# Patient Record
Sex: Male | Born: 1935 | Race: White | Hispanic: No | Smoking: Former smoker
Health system: Southern US, Community
[De-identification: ages and names within clinical notes are randomized; demographics above are authoritative.]

## PROBLEM LIST (undated history)

## (undated) DIAGNOSIS — I739 Peripheral vascular disease, unspecified: Secondary | ICD-10-CM

## (undated) DIAGNOSIS — I10 Essential (primary) hypertension: Secondary | ICD-10-CM

## (undated) DIAGNOSIS — K648 Other hemorrhoids: Secondary | ICD-10-CM

## (undated) DIAGNOSIS — I440 Atrioventricular block, first degree: Secondary | ICD-10-CM

## (undated) DIAGNOSIS — I480 Paroxysmal atrial fibrillation: Secondary | ICD-10-CM

## (undated) DIAGNOSIS — I779 Disorder of arteries and arterioles, unspecified: Secondary | ICD-10-CM

## (undated) DIAGNOSIS — K635 Polyp of colon: Secondary | ICD-10-CM

## (undated) DIAGNOSIS — R0789 Other chest pain: Secondary | ICD-10-CM

## (undated) HISTORY — DX: Other hemorrhoids: K64.8

## (undated) HISTORY — DX: Peripheral vascular disease, unspecified: I73.9

## (undated) HISTORY — PX: OTHER SURGICAL HISTORY: SHX169

## (undated) HISTORY — DX: Disorder of arteries and arterioles, unspecified: I77.9

## (undated) HISTORY — PX: LUMBAR LAMINECTOMY: SHX95

## (undated) HISTORY — DX: Other chest pain: R07.89

## (undated) HISTORY — DX: Polyp of colon: K63.5

## (undated) HISTORY — DX: Essential (primary) hypertension: I10

## (undated) HISTORY — DX: Paroxysmal atrial fibrillation: I48.0

## (undated) HISTORY — DX: Atrioventricular block, first degree: I44.0

## (undated) HISTORY — PX: CHOLECYSTECTOMY: SHX55

---

## 2006-10-30 ENCOUNTER — Ambulatory Visit: Payer: Self-pay | Admitting: Cardiology

## 2007-02-10 ENCOUNTER — Ambulatory Visit: Payer: Self-pay | Admitting: Cardiology

## 2007-02-11 ENCOUNTER — Encounter: Payer: Self-pay | Admitting: Cardiology

## 2007-03-07 ENCOUNTER — Ambulatory Visit: Payer: Self-pay | Admitting: Cardiology

## 2007-09-23 ENCOUNTER — Encounter: Payer: Self-pay | Admitting: Cardiology

## 2007-12-27 ENCOUNTER — Encounter: Payer: Self-pay | Admitting: Cardiology

## 2008-01-15 ENCOUNTER — Encounter: Payer: Self-pay | Admitting: Cardiology

## 2008-09-11 ENCOUNTER — Ambulatory Visit: Payer: Self-pay | Admitting: Cardiology

## 2008-09-15 ENCOUNTER — Encounter: Payer: Self-pay | Admitting: Cardiology

## 2008-10-28 DIAGNOSIS — I480 Paroxysmal atrial fibrillation: Secondary | ICD-10-CM | POA: Insufficient documentation

## 2009-05-11 DIAGNOSIS — C4492 Squamous cell carcinoma of skin, unspecified: Secondary | ICD-10-CM

## 2009-05-11 HISTORY — DX: Squamous cell carcinoma of skin, unspecified: C44.92

## 2009-05-13 ENCOUNTER — Encounter: Payer: Self-pay | Admitting: Cardiology

## 2009-10-19 ENCOUNTER — Ambulatory Visit: Payer: Self-pay | Admitting: Cardiology

## 2009-10-19 DIAGNOSIS — I10 Essential (primary) hypertension: Secondary | ICD-10-CM

## 2010-03-08 NOTE — Assessment & Plan Note (Signed)
Summary: one year follow up/rm   Visit Type:  Follow-up Primary Cullan Launer:  Sherril Croon   History of Present Illness: the patient is an 77 year old male with a longstanding history of paroxysmal atrial fibrillation. The patient denies any chest pain, shortness of breath, orthopnea, or PND. He has a CHADS score of 1-2.he's taking Coumadin and is tolerating without any side effects.  Does have occasional bruises however no significant bleeding.  He reports occasional palpitations.  He keeps a record of his blood pressure at home and this has been within normal limits.  Preventive Screening-Counseling & Management  Alcohol-Tobacco     Smoking Status: quit     Year Quit: 1946  Current Medications (verified): 1)  Inderal La 60 Mg Xr24h-Cap (Propranolol Hcl) .... Take 1 Tablet By Mouth Once A Day 2)  Doxepin Hcl 25 Mg Caps (Doxepin Hcl) .... Take 1 Tablet By Mouth Once A Day 3)  Coumadin 5 Mg Tabs (Warfarin Sodium) .... Use As Directed 4)  Lanoxin 0.125 Mg Tabs (Digoxin) .... Take 1 Tablet By Mouth Once A Day 5)  Flomax 0.4 Mg Caps (Tamsulosin Hcl) .... Take 1 Tablet By Mouth Once A Day 6)  Avodart 0.5 Mg Caps (Dutasteride) .... Take 1 Tablet By Mouth Once A Day 7)  Flurazepam Hcl 15 Mg Caps (Flurazepam Hcl) .... Take 1 Tablet By Mouth Once A Day  Allergies (verified): 1)  ! Pcn  Comments:  Nurse/Medical Assistant: The patient's medication list and allergies were reviewed with the patient and were updated in the Medication and Allergy Lists.  Past History:  Past Medical History: Last updated: 10/28/2008 ATRIAL FIBRILLATION (ICD-427.31)    Past Surgical History: Last updated: 10/28/2008 Cataract Extraction Bladder  Family History: Last updated: 10/28/2008 Family History of Cancer:  Alzheimer's  Social History: Last updated: 10/28/2008 Full Time Tobacco Use - No.  Alcohol Use - yes  Risk Factors: Smoking Status: quit (10/19/2009)  Social History: Smoking Status:   quit  Review of Systems       The patient complains of fatigue.  The patient denies malaise, fever, weight gain/loss, vision loss, decreased hearing, hoarseness, chest pain, palpitations, shortness of breath, prolonged cough, wheezing, sleep apnea, coughing up blood, abdominal pain, blood in stool, nausea, vomiting, diarrhea, heartburn, incontinence, blood in urine, muscle weakness, joint pain, leg swelling, rash, skin lesions, headache, fainting, dizziness, depression, anxiety, enlarged lymph nodes, easy bruising or bleeding, and environmental allergies.    Vital Signs:  Patient profile:   77 year old male Height:      72 inches Weight:      182 pounds BMI:     24.77 Pulse rate:   51 / minute BP sitting:   150 / 82  (left arm) Cuff size:   regular  Vitals Entered By: Carlye Grippe (October 19, 2009 8:46 AM)  Physical Exam  Additional Exam:  General: Well-developed, well-nourished in no distress head: Normocephalic and atraumatic eyes PERRLA/EOMI intact, conjunctiva and lids normal nose: No deformity or lesions mouth normal dentition, normal posterior pharynx neck: Supple, no JVD.  No masses, thyromegaly or abnormal cervical nodes lungs: Normal breath sounds bilaterally without wheezing.  Normal percussion heart: regular rate and rhythm with normal S1 and S2, no S3 or S4.  PMI is normal.  No pathological murmurs abdomen: Normal bowel sounds, abdomen is soft and nontender without masses, organomegaly or hernias noted.  No hepatosplenomegaly musculoskeletal: Back normal, normal gait muscle strength and tone normal pulsus: Pulse is normal in all 4 extremities Extremities:  No peripheral pitting edema neurologic: Alert and oriented x 3 skin: Intact without lesions or rashes cervical nodes: No significant adenopathy psychologic: Normal affect    EKG  Procedure date:  10/19/2009  Findings:       sinus bradycardia nonspecific ST-T wave changes  Impression &  Recommendations:  Problem # 1:  ATRIAL FIBRILLATION (ICD-427.31) no recurrence the patient is in normal sinus rhythm. His updated medication list for this problem includes:    Inderal La 60 Mg Xr24h-cap (Propranolol hcl) .Marland Kitchen... Take 1 tablet by mouth once a day    Coumadin 5 Mg Tabs (Warfarin sodium) ..... Use as directed    Lanoxin 0.125 Mg Tabs (Digoxin) .Marland Kitchen... Take 1 tablet by mouth once a day  Orders: EKG w/ Interpretation (93000)  Problem # 2:  ESSENTIAL HYPERTENSION, BENIGN (ICD-401.1) blood pressure controlled with current medical regimen. His updated medication list for this problem includes:    Inderal La 60 Mg Xr24h-cap (Propranolol hcl) .Marland Kitchen... Take 1 tablet by mouth once a day  Problem # 3:  COUMADIN THERAPY (ICD-V58.61) Assessment: Comment Only  Patient Instructions: 1)  Your physician recommends that you continue on your current medications as directed. Please refer to the Current Medication list given to you today. 2)  Follow up in  1 year.

## 2010-06-06 ENCOUNTER — Encounter: Payer: Self-pay | Admitting: Cardiology

## 2010-06-06 ENCOUNTER — Ambulatory Visit (INDEPENDENT_AMBULATORY_CARE_PROVIDER_SITE_OTHER): Payer: Medicare Other | Admitting: Cardiology

## 2010-06-06 ENCOUNTER — Encounter: Payer: Self-pay | Admitting: *Deleted

## 2010-06-06 VITALS — BP 168/85 | HR 58 | Ht 72.0 in | Wt 188.0 lb

## 2010-06-06 DIAGNOSIS — I4891 Unspecified atrial fibrillation: Secondary | ICD-10-CM

## 2010-06-06 DIAGNOSIS — Z01810 Encounter for preprocedural cardiovascular examination: Secondary | ICD-10-CM

## 2010-06-06 DIAGNOSIS — I6529 Occlusion and stenosis of unspecified carotid artery: Secondary | ICD-10-CM

## 2010-06-06 NOTE — Assessment & Plan Note (Signed)
The patient is at acceptable risk for the planned surgery according to ACC/AHA guidelines. No further cardiovascular testing is suggested. He can come off his Coumadin 5 days prior to surgery and restart it when suggested by his surgeon.

## 2010-06-06 NOTE — Progress Notes (Signed)
HPI Patient is a pleasant gentleman who is being considered for cholecystectomy. He has a history of atrial fibrillation. He has maintained sinus rhythm and maintain his Coumadin. He has no significant thromboembolic risk other than probable hypertension and his age. He has not felt any tachycardia palpitations except for one episode where his heart rate was said to be elevated on a treadmill though he didn't feel this. In the past he has known when he has had fibrillation. He has had no presyncope or syncope. He denies any chest pressure, neck or arm discomfort. He has had no shortness of breath, PND or orthopnea. He has had no weight gain or edema. His last stress perfusion study was 2009 negative for any evidence of ischemia.   Allergies  Allergen Reactions  . Penicillins     REACTION: rash    Current Outpatient Prescriptions  Medication Sig Dispense Refill  . doxepin (SINEQUAN) 25 MG capsule Take 50 mg by mouth daily.       Marland Kitchen dutasteride (AVODART) 0.5 MG capsule Take 0.5 mg by mouth daily.        . flurazepam (DALMANE) 15 MG capsule Take 15 mg by mouth at bedtime as needed.        . propranolol (INDERAL LA) 60 MG 24 hr capsule Take 60 mg by mouth daily.        . sildenafil (VIAGRA) 50 MG tablet Take 50 mg by mouth daily as needed.        . Tamsulosin HCl (FLOMAX) 0.4 MG CAPS Take 0.4 mg by mouth daily.        Marland Kitchen warfarin (COUMADIN) 5 MG tablet Take 5 mg by mouth as directed.        Marland Kitchen DISCONTD: digoxin (LANOXIN) 0.125 MG tablet Take 125 mcg by mouth daily.          Past Medical History  Diagnosis Date  . Atrial fibrillation   . Colon polyps   . Internal hemorrhoids     Past Surgical History  Procedure Date  . Abcess of right thigh     ROS:  As stated in the HPI and negative for all other systems.  PHYSICAL EXAM BP 168/85  Pulse 58  Ht 6' (1.829 m)  Wt 188 lb (85.276 kg)  BMI 25.50 kg/m2 GENERAL:  Well appearing.  He looks much younger than his stated age. HEENT:  Pupils  equal round and reactive, fundi not visualized, oral mucosa unremarkable NECK:  No jugular venous distention, waveform within normal limits, carotid upstroke brisk and symmetric, no bruits, no thyromegaly LYMPHATICS:  No cervical, inguinal adenopathy LUNGS:  Clear to auscultation bilaterally BACK:  No CVA tenderness CHEST:  Unremarkable HEART:  PMI not displaced or sustained,S1 and S2 within normal limits, no S3, no S4, no clicks, no rubs, no murmurs ABD:  Flat, positive bowel sounds normal in frequency in pitch, no bruits, no rebound, no guarding, no midline pulsatile mass, no hepatomegaly, no splenomegaly EXT:  2 plus pulses throughout, no edema, no cyanosis no clubbing SKIN:  No rashes no nodules NEURO:  Cranial nerves II through XII grossly intact, motor grossly intact throughout PSYCH:  Cognitively intact, oriented to person place and time   EKG:  Sinus bradycardia, rate 54, axis within normal limits, intervals within normal limits, no acute ST-T wave changes.  ASSESSMENT AND PLAN

## 2010-06-06 NOTE — Assessment & Plan Note (Signed)
The blood pressure continues to be high. I have instructed the patient to record a blood pressure diary and recording this. This will be presented for my review and pending these results I will make further suggestions about changes in therapy for optimal blood pressure control.  

## 2010-06-06 NOTE — Assessment & Plan Note (Signed)
As above.

## 2010-06-06 NOTE — Patient Instructions (Signed)
Your physician (Dr. Earnestine Leys ) you to follow up in 1 year. You will receive a reminder letter in the mail one-two months in advance. If you don't receive a letter, please call our office to schedule the follow-up appointment.  Your physician has requested that you have a carotid duplex. This test is an ultrasound of the carotid arteries in your neck. It looks at blood flow through these arteries that supply the brain with blood. Allow one hour for this exam. There are no restrictions or special instructions. Your physician recommends that you continue on your current medications as directed. Please refer to the Current Medication list given to you today.

## 2010-06-06 NOTE — Assessment & Plan Note (Signed)
He had mild obstruction in 2007 I will repeat carotid Dopplers.

## 2010-06-06 NOTE — Progress Notes (Deleted)
HPI  Allergies  Allergen Reactions  . Penicillins     REACTION: rash    Current Outpatient Prescriptions  Medication Sig Dispense Refill  . doxepin (SINEQUAN) 25 MG capsule Take 50 mg by mouth daily.       Marland Kitchen dutasteride (AVODART) 0.5 MG capsule Take 0.5 mg by mouth daily.        . flurazepam (DALMANE) 15 MG capsule Take 15 mg by mouth at bedtime as needed.        . propranolol (INDERAL LA) 60 MG 24 hr capsule Take 60 mg by mouth daily.        . sildenafil (VIAGRA) 50 MG tablet Take 50 mg by mouth daily as needed.        . Tamsulosin HCl (FLOMAX) 0.4 MG CAPS Take 0.4 mg by mouth daily.        Marland Kitchen warfarin (COUMADIN) 5 MG tablet Take 5 mg by mouth as directed.        Marland Kitchen DISCONTD: digoxin (LANOXIN) 0.125 MG tablet Take 125 mcg by mouth daily.          Past Medical History  Diagnosis Date  . Atrial fibrillation   . Colon polyps   . Internal hemorrhoids     Past Surgical History  Procedure Date  . Abcess of right thigh     ROS: PHYSICAL EXAM BP 168/85  Pulse 58  Ht 6' (1.829 m)  Wt 188 lb (85.276 kg)  BMI 25.50 kg/m2 GENERAL:  Well appearing HEENT:  Pupils equal round and reactive, fundi not visualized, oral mucosa unremarkable NECK:  No jugular venous distention, waveform within normal limits, carotid upstroke brisk and symmetric, no bruits, no thyromegaly LYMPHATICS:  No cervical, inguinal adenopathy LUNGS:  Clear to auscultation bilaterally BACK:  No CVA tenderness CHEST:  Unremarkable HEART:  PMI not displaced or sustained,S1 and S2 within normal limits, no S3, no S4, no clicks, no rubs, no murmurs ABD:  Flat, positive bowel sounds normal in frequency in pitch, no bruits, no rebound, no guarding, no midline pulsatile mass, no hepatomegaly, no splenomegaly EXT:  2 plus pulses throughout, no edema, no cyanosis no clubbing SKIN:  No rashes no nodules NEURO:  Cranial nerves II through XII grossly intact, motor grossly intact throughout PSYCH:  Cognitively intact, oriented  to person place and time  EKG:  ASSESSMENT AND PLAN

## 2010-06-21 NOTE — Assessment & Plan Note (Signed)
[facility]                          [facility] NOTE   NAME:Antonio Moreno, Antonio Moreno                       MRN:          [id number]  DATE:03/07/2007                            DOB:          Mar 25, 1920    HISTORY OF PRESENT ILLNESS:  The patient is a very pleasant 77 year old  male still working with a longstanding paroxysmal atrial fibrillation  who was last seen by Joellyn Rued on October 30, 2006. The patient was  referred by Dr. Cleotis Nipper with the specific question of if the patient  should still continue on Coumadin.  After some discussion, it was felt  that the patient's best option would be to stay on Coumadin at his [place]  score was essentially 2.  The patient was also recently admitted to  [facility] after an episode of abdominal pain and Dr. Sherril Croon  requested a consult from Dr. Andee Poles.  The patient was ruled out for  myocardial infarction and had a Cardiolite stress study done, which was  within normal limits.  The patient felt, in retrospect, that this was  more indigestion like sensation that he had and told me here in the  office today that he did not really have any chest pain on that  admission.  He is doing quite well.  He remains very active, walks  daily. Reports no substernal chest pain or shortness of breath.  He  still continues to travel to [place]. He also goes to see  physicians at the [facility] in [place].   MEDICATIONS:  1. Inderal 60 mg p.o. daily.  2. Lanoxin 25 mg a half tablet daily.  3. Doxepin.  4. Coumadin as directed.  5. Flomax.  6. Amitiza.   PHYSICAL EXAMINATION:  VITAL SIGNS:  Blood pressure 132/69, heart rate  is 58 beats a minute.  NECK:  Normal carotid.  No carotid bruits.  LUNGS:  Clear breath sounds bilaterally.  HEART:  Regular rate and rhythm.  Normal S1, S2. No murmurs, rubs or  gallops.  ABDOMEN:  Soft, nontender; no rebound or guarding.  Good bowel sounds.  EXTREMITIES: no  cyanosis, clubbing or edema.   PROBLEM LIST:  1. History of paroxysmal atrial fibrillation.  2. Coumadin anticoagulation, [place] score 2.  3. Status post recent admission for atypical abdominal pain.      a.     Negative Cardiolite imaging study.   PLAN:  1. The patient is doing remarkably well for his age.  He appears to be      at low risk for coronary disease given his Cardiolite results.  2. Made no changes in the patient medical regimen.  3. We had a discussion about Coumadin again today. Despite the      patients advanced age, it      still seems very safe option and he certainly is at increased risk      for stroke given the [place] score of 2.     Learta Codding, MD,FACC  Electronically Signed    GED/MedQ  DD: 03/07/2007  DT: 03/07/2007  Job #: [id number]

## 2010-06-21 NOTE — Assessment & Plan Note (Signed)
[facility]                          [facility]ICE NOTE   NAME:Mckeen, DIVANTE KOTCH                       MRN:          [id number]  DATE:10/30/2006                            DOB:          [birth year]    SUMMARY OF HISTORY:  Mr. Hoffmann is an 77 year old white male who is  referred from Dr. Cleotis Nipper in regards to possible cessation of  anticoagulation therapy with Coumadin.   Mr. Styer states that he has a long history of atrial fibrillation  dating back to age 89. He states that when he has atrial fibrillation,  he will have a rapid ventricular rate and this is associated with a  feeling of faintness that he cannot specifically elaborate. He has not  had any syncopal episodes. He thinks that his last episode of atrial  fibrillation was over a year ago and this lasted approximately for 10  minutes. He states that he was started on Coumadin in 2003 when he was  hospitalized at [facility] for atrial fibrillation, he states  that at that time it lasted slightly over 2 hours and it was recommended  that he stay on anticoagulation therapy. He said that he was also  followed up at the [facility] in March 2007 and they also, again,  recommended continued Coumadin therapy, although with his extensive  travels to potentially [place], it would be difficult to maintain  his INRs.   ALLERGIES:  COUMADIN.   MEDICATIONS:  1. Inderal 60 mg daily.  2. Lanoxin 25 mg 1/2 tablet daily.  3. Amitiza 24 mg daily.  4. Doxepin 50 mg daily.  5. Coumadin as directed per primary care physician.  6. Omega 3 1200 mg daily.  7. Flomax p.r.n.  8. Dalmane p.r.n.  9. Glucosamine p.r.n.   PAST MEDICAL HISTORY:  In addition to paroxysmal atrial fibrillation is  notable tonsillectomy, bladder biopsy, diverticulosis, remote colon  polyps, and internal hemorrhoids. He states that he has just had a  colonoscopy within the last couple of weeks and was given a  clean bill  of health. He has a history of a transanal resection of a tubular  adenoma of the rectum and ligation of internal hemorrhoids in August  2005.   SOCIAL HISTORY:  Remain unchanged. [place] and [place].   FAMILY HISTORY:  Remain unchanged.   EKG in the office shows sinus bradycardia with a ventricular rate of 50,  normal axis, non-specific ST-T wave changes, possibly dig effect.   PHYSICAL EXAMINATION:  GENERAL:  Well developed, well nourished,  pleasant white male in no apparent distress. He appears younger than his  stated age.  VITAL SIGNS:  Blood pressure 151/84, recheck 136/70, heart rate 53 and  regular, weight 192.8.  HEENT:  Unremarkable.  NECK:  Supple without thyromegaly, adenopathy, JVD, or carotid bruits.  CHEST:  Symmetrical excursion. I did not appreciate any rales, rhonchi,  or wheezing.  HEART:  PMI is not displaced. Regular rate and rhythm. Normal S1 and S2.  I did  not appreciate any murmurs, rubs, clicks, or gallops. I did not  appreciate any abdominal or femoral bruits. All peripheral pulses were  symmetrical and intact.  ABDOMEN:  Flat, bowel sounds present without organomegaly, masses, or  tenderness.  EXTREMITIES:  Negative cyanosis, clubbing, or edema.  MUSCULOSKELETAL:  Unremarkable.  NEUROLOGIC:  Unremarkable.   IMPRESSION:  1. History of paroxysmal atrial fibrillation with probably the last      event at least one year ago.  2. Anticoagulation managed by his primary care physician.  3. History as noted above.   DISPOSITION:  I reviewed with Mr. Cacioppo the indication for Coumadin  based on his [place] scoring. I also provided him with information of the  guidance of management with patients of atrial fibrillation representing  [place] scoring. Using this information, the patient has a [place] score of 1.  However, the patient states that he has a history of  borderline  diabetes, however his last hemoglobin a1C within the last year was 5.5.  If we include his borderline diabetes, he has a [place] score of 2. I  have informed Mr. Loeber that he could potentially discontinue Coumadin  and begin aspirin for protective risk. However, at this time, Mr. Hoglund  is reluctant to discontinue Coumadin as he has not had any problems with  it in the past. He states that he is willing to look over the  information provided, give it some thought, as well as discuss this with  Dr. Cleotis Nipper and Dr. Sherril Croon. He will inform us of his decision as to  whether he wishes to continue Coumadin or discontinue Coumadin and begin  aspirin.      Joellyn Rued, PA-C  Electronically Signed      Learta Codding, MD,FACC  Electronically Signed   EW/MedQ  DD: 10/30/2006  DT: 10/31/2006  Job #: [id number]   cc:   Henry A. Cleotis Nipper, M.D.  Dhruv Sherril Croon

## 2010-06-21 NOTE — Assessment & Plan Note (Signed)
[facility]                          [facility]ICE NOTE   NAME:Antonio Moreno, Antonio Moreno                       MRN:          [id number]  DATE:09/11/2008                            DOB:          Feb 21, 1920    HISTORY OF PRESENT ILLNESS:  The patient is a very pleasant 77 year old  male, still working with a longstanding history of paroxysmal atrial  fibrillation.  The patient denies any chest pain, shortness of breath,  orthopnea, or PND.  He has a CHADS score of 1-2.  He does have at times  elevated blood sugars, but does not have hypertension.  The patient is,  however, on Coumadin.  The patient travels quite a bit including trips  to [place] and [place], and he also has physicians at the [facility] in [place].  The patient is otherwise asymptomatic.   MEDICATIONS:  1. Inderal 60 mg p.o. daily.  2. Doxepin 50 mg p.o. daily.  3. Coumadin as directed.  4. Lanoxin 0.25 mg 1/2 tab p.o. daily.  5. Flomax 0.4 mg daily.  6. Avodart 0.5 mg p.o. daily.  7. Amitiza 24 mg p.o. b.i.d.   PHYSICAL EXAMINATION:  VITAL SIGNS:  Blood pressure is 128/86, heart  rate is 52, and weight is 181 pounds.  GENERAL:  A well-nourished white male in no apparent distress.  HEENT:  Pupils are anisocoric.  Conjunctivae are clear.  NECK:  Supple.  Normal carotid upstroke.  No carotid bruits.  LUNGS:  Clear breath sounds bilaterally.  HEART:  Regular rate and rhythm.  Normal S1 and S2.  No murmur, rubs, or  gallops.  ABDOMEN:  Soft and nontender.  No rebound or guarding.  Good bowel  sounds.  EXTREMITIES:  No cyanosis, clubbing, or edema.  NEUROLOGIC:  The patient is alert, oriented, and grossly nonfocal.   PROBLEMS:  1. History paroxysmal atrial fibrillation (rare recurrence).  2. Coumadin anticoagulation.  3. Negative Cardiolite imaging study.   PLAN:  1. The patient is doing well.  I do not think we need to change his      medical regimen at this   point in time.  2. The patient is due for blood work.  We will order CBC, CMET, and      TSH as well as a lipid panel.     Learta Codding, MD,FACC  Electronically Signed    GED/MedQ  DD: 09/11/2008  DT: 09/12/2008  Job #: [id number]

## 2010-06-28 ENCOUNTER — Encounter (HOSPITAL_COMMUNITY)
Admission: RE | Admit: 2010-06-28 | Discharge: 2010-06-28 | Disposition: A | Discharge status: Home / Self Care | Payer: Medicare Other | Source: Ambulatory Visit | Attending: General Surgery | Admitting: General Surgery

## 2010-06-28 ENCOUNTER — Telehealth: Payer: Self-pay | Admitting: Cardiology

## 2010-06-28 LAB — CBC
HCT: 41.6 % (ref 39.0–52.0)
Hemoglobin: 14 g/dL (ref 13.0–17.0)
MCV: 93.5 fL (ref 78.0–100.0)
WBC: 7.1 10*3/uL (ref 4.0–10.5)

## 2010-06-28 LAB — BASIC METABOLIC PANEL
GFR calc non Af Amer: >60 mL/min (ref >60)
Potassium: 4.6 mEq/L (ref 3.5–5.1)
Sodium: 141 mEq/L (ref 135–145)

## 2010-06-28 LAB — SURGICAL PCR SCREEN
MRSA, PCR: NEGATIVE
Staphylococcus aureus: NEGATIVE

## 2010-06-28 LAB — APTT: aPTT: 45 seconds — ABNORMAL HIGH (ref 24–37)

## 2010-06-28 LAB — PROTIME-INR: INR: 1.51 — ABNORMAL HIGH (ref 0.00–1.49)

## 2010-06-28 NOTE — Telephone Encounter (Signed)
Faxed EKG to [facility] Short Stay ([phone]).

## 2010-06-30 ENCOUNTER — Inpatient Hospital Stay (HOSPITAL_COMMUNITY)
Admission: RE | Admit: 2010-06-30 | Discharge: 2010-07-02 | DRG: 419 | Disposition: A | Discharge status: Home / Self Care | Payer: Medicare Other | Source: Ambulatory Visit | Attending: General Surgery | Admitting: General Surgery

## 2010-06-30 ENCOUNTER — Inpatient Hospital Stay (HOSPITAL_COMMUNITY): Payer: Medicare Other

## 2010-06-30 ENCOUNTER — Other Ambulatory Visit (HOSPITAL_COMMUNITY): Payer: Self-pay | Admitting: General Surgery

## 2010-06-30 ENCOUNTER — Ambulatory Visit (HOSPITAL_COMMUNITY)
Admission: RE | Admit: 2010-06-30 | Discharge: 2010-06-30 | Disposition: A | Discharge status: Home / Self Care | Payer: Medicare Other | Source: Ambulatory Visit | Attending: General Surgery | Admitting: General Surgery

## 2010-06-30 ENCOUNTER — Other Ambulatory Visit: Payer: Self-pay | Admitting: General Surgery

## 2010-06-30 DIAGNOSIS — K8020 Calculus of gallbladder without cholecystitis without obstruction: Secondary | ICD-10-CM

## 2010-06-30 DIAGNOSIS — K8070 Calculus of gallbladder and bile duct without cholecystitis without obstruction: Principal | ICD-10-CM | POA: Diagnosis present

## 2010-06-30 DIAGNOSIS — I4891 Unspecified atrial fibrillation: Secondary | ICD-10-CM | POA: Diagnosis present

## 2010-06-30 DIAGNOSIS — I10 Essential (primary) hypertension: Secondary | ICD-10-CM | POA: Diagnosis present

## 2010-06-30 DIAGNOSIS — E119 Type 2 diabetes mellitus without complications: Secondary | ICD-10-CM | POA: Diagnosis present

## 2010-06-30 DIAGNOSIS — Z880 Allergy status to penicillin: Secondary | ICD-10-CM

## 2010-06-30 DIAGNOSIS — Z01812 Encounter for preprocedural laboratory examination: Secondary | ICD-10-CM

## 2010-06-30 DIAGNOSIS — K8050 Calculus of bile duct without cholangitis or cholecystitis without obstruction: Secondary | ICD-10-CM

## 2010-06-30 DIAGNOSIS — Z7901 Long term (current) use of anticoagulants: Secondary | ICD-10-CM

## 2010-06-30 DIAGNOSIS — K219 Gastro-esophageal reflux disease without esophagitis: Secondary | ICD-10-CM | POA: Diagnosis present

## 2010-06-30 LAB — GLUCOSE, CAPILLARY: Glucose-Capillary: 131 mg/dL — ABNORMAL HIGH (ref 70–99)

## 2010-07-01 ENCOUNTER — Inpatient Hospital Stay (HOSPITAL_COMMUNITY): Payer: Medicare Other

## 2010-07-01 LAB — HEPATIC FUNCTION PANEL
Bilirubin, Direct: 0.2 mg/dL (ref 0.0–0.3)
Indirect Bilirubin: 0.7 mg/dL (ref 0.3–0.9)
Total Bilirubin: 0.9 mg/dL (ref 0.3–1.2)

## 2010-07-01 LAB — GLUCOSE, CAPILLARY: Glucose-Capillary: 118 mg/dL — ABNORMAL HIGH (ref 70–99)

## 2010-07-01 LAB — PROTIME-INR: Prothrombin Time: 16.1 seconds — ABNORMAL HIGH (ref 11.6–15.2)

## 2010-07-02 LAB — GLUCOSE, CAPILLARY: Glucose-Capillary: 123 mg/dL — ABNORMAL HIGH (ref 70–99)

## 2010-07-05 NOTE — Op Note (Signed)
Antonio Moreno, Antonio Moreno                ACCOUNT NO.:  [id number]  MEDICAL RECORD NO.:  [id number]           PATIENT TYPE:  I  LOCATION:  5120                         FACILITY:  [facility]  PHYSICIAN:  Gabrielle Dare. Janee Morn, M.D.DATE OF BIRTH:  [birth year]  DATE OF PROCEDURE:  06/30/2010 DATE OF DISCHARGE:                              OPERATIVE REPORT   PREOPERATIVE DIAGNOSIS:  Symptomatic cholelithiasis.  POSTOPERATIVE DIAGNOSES: 1. Symptomatic cholelithiasis. 2. Choledocholithiasis.  PROCEDURE:  Laparoscopic cholecystectomy with intraoperative cholangiogram.  SURGEON:  Gabrielle Dare. Janee Morn, MD  ASSISTANT:  Sandria Bales. Ezzard Standing, MD  ANESTHESIA:  General endotracheal.  HISTORY OF PRESENT ILLNESS:  Antonio Moreno is an 77 year old gentleman who presents for elective cholecystectomy for symptomatic cholelithiasis. He underwent preoperative cardiac clearance and held his Coumadin.  PROCEDURE IN DETAIL:  Informed consent was obtained.  The patient was identified in the preop holding area.  He received intravenous antibiotics.  He was brought to the operating room.  General endotracheal anesthesia was administered by the Anesthesia staff.  His abdomen was prepped and draped in a sterile fashion.  We did a time-out procedure.  The infraumbilical region was infiltrated with 0.25% Marcaine with epinephrine.  An infraumbilical incision was made. Subcutaneous tissues were dissected down revealing the anterior fascia. This was divided sharply along the midline and the peritoneal cavity was entered under direct vision.  A purse-string suture of 0 Vicryl was placed around the fascial opening.  The Hasson trocar was inserted into the abdomen.  The abdomen was insufflated with carbon dioxide in a standard fashion.  Under direct vision, a 5-mm epigastric and two 5-mm right lateral ports were placed with local anesthetic used at port sites as well.  Laparoscopic exploration revealed some evidence of  chronic cholecystitis.  There was adherent omentum to the gallbladder wall.  The gallbladder dome was retracted superomedially.  The omentum was gradually taken down using cautery sweeping it away off the body and finally the infundibulum of the gallbladder.  Once that was cleared away, the infundibulum was retracted inferolaterally.  Dissection began laterally and progressed medially easily identifying the cystic duct. There was some good length on it.  We continued dissection until a large window was created between the cystic duct and infundibulum of the gallbladder and the liver.  Once we had critical view, clip was placed on the infundibulum and cystic duct junction.  A small nick was made in the cystic duct and a cholangiogram catheter was inserted. Intraoperative cholangiogram was obtained demonstrating distal common bile duct filling defect with no emptying of contrast into the duodenum. We tried to flush further contrast, but we were not able to get that apparent stone to pass out of the distal common bile duct.  Next, a cholangiogram catheter was removed and 4 clips were placed proximally on the cystic duct and it was divided.  The cystic artery was then dissected out and was clipped twice proximally and once distally and divided.  The gallbladder was taken off the liver bed using Bovie cautery achieving excellent hemostasis along the way.  The gallbladder was not ruptured.  The gallbladder was placed in  an Endocatch bag and removed from the abdomen via the infraumbilical port site.  Liver bed was then cauterized to get excellent hemostasis.  We used warm irrigation and fluid irrigation, fluid returned clear.  Clips remained in good position and the liver bed was dry.  Pneumoperitoneum was then released.  Ports were removed.  Infraumbilical fascia was closed by tying 0 Vicryl purse-string suture with care not to trap any intraabdominal contents.  All 4 wounds were copiously  irrigated.  The skin of each was then closed with running 4-0 Vicryl subcuticular stitch followed by Dermabond.  Sponge, needle, and instrument counts were all correct.  The patient tolerated the procedure well without apparent complication.  He was taken to the recovery room in stable condition and I spoke with [facility] and Dr. Leone Payor will be seeing the patient for possible ERCP tomorrow.  We will continue to hold anticoagulation and will maintain him on IV antibiotics and give him clear liquids and make him n.p.o. after midnight.     Gabrielle Dare Janee Morn, M.D.     BET/MEDQ  D:  06/30/2010  T:  07/01/2010  Job:  [id number]  cc:   Iva Boop, MD,FACG Doreen Beam, MD  Electronically Signed by Violeta Gelinas M.D. on 07/05/2010 01:16:47 PM

## 2010-07-27 ENCOUNTER — Encounter (INDEPENDENT_AMBULATORY_CARE_PROVIDER_SITE_OTHER): Payer: Self-pay | Admitting: General Surgery

## 2010-09-13 ENCOUNTER — Ambulatory Visit (INDEPENDENT_AMBULATORY_CARE_PROVIDER_SITE_OTHER): Payer: Medicare Other | Admitting: Urology

## 2010-09-13 DIAGNOSIS — N400 Enlarged prostate without lower urinary tract symptoms: Secondary | ICD-10-CM

## 2011-01-17 ENCOUNTER — Ambulatory Visit (INDEPENDENT_AMBULATORY_CARE_PROVIDER_SITE_OTHER): Payer: Medicare Other | Admitting: Urology

## 2011-01-17 DIAGNOSIS — N400 Enlarged prostate without lower urinary tract symptoms: Secondary | ICD-10-CM

## 2011-03-27 ENCOUNTER — Other Ambulatory Visit: Payer: Self-pay | Admitting: Cardiology

## 2011-05-24 ENCOUNTER — Encounter (INDEPENDENT_AMBULATORY_CARE_PROVIDER_SITE_OTHER): Payer: Medicare Other

## 2011-05-24 DIAGNOSIS — I6529 Occlusion and stenosis of unspecified carotid artery: Secondary | ICD-10-CM

## 2011-05-30 ENCOUNTER — Encounter: Payer: Self-pay | Admitting: *Deleted

## 2011-06-13 ENCOUNTER — Encounter: Payer: Self-pay | Admitting: Cardiology

## 2011-06-13 ENCOUNTER — Ambulatory Visit (INDEPENDENT_AMBULATORY_CARE_PROVIDER_SITE_OTHER): Payer: Medicare Other | Admitting: Cardiology

## 2011-06-13 VITALS — BP 147/74 | HR 65 | Ht 72.0 in | Wt 189.0 lb

## 2011-06-13 DIAGNOSIS — R0789 Other chest pain: Secondary | ICD-10-CM

## 2011-06-13 DIAGNOSIS — I6529 Occlusion and stenosis of unspecified carotid artery: Secondary | ICD-10-CM

## 2011-06-13 DIAGNOSIS — I4891 Unspecified atrial fibrillation: Secondary | ICD-10-CM

## 2011-06-13 NOTE — Progress Notes (Signed)
Antonio Bottoms, MD, [facility] Board Certified in Adult Cardiovascular Medicine,Internal Medicine and Critical Care Medicine    CC: Patient presents for followup of a history of atrial fibrillation  HPI:  The patient had recent blood work done which was all within normal limits. His LDL was 98 mg percent. He reports no significant chest pain. He remains very active. He still works as a Scientist, research (life sciences). He exercises frequently and has noted that his heart rate sometimes would go up to 220 beats per minute but this is on a monitor in the gym and I suspect this is a false reading. The patient is actually asymptomatic with this. He lives a very active lifestyle and reports no shortness of breath either at rest on exertion. He has no orthopnea PND.   PMH: reviewed and listed in Problem List in Electronic Records (and see below) Past Medical History  Diagnosis Date  . Atrial fibrillation     Normal sinus rhythm on Coumadin  . Colon polyps   . Internal hemorrhoids   . Mild carotid artery disease      carotid Dopplers 0-39% stenosis bilaterally March 2013   . Atypical chest pain      Cardiolite 2009 without ischemia    Past Surgical History  Procedure Date  . Abcess of right thigh   . Lumbar laminectomy     Allergies/SH/FHX : available in Electronic Records for review  Allergies  Allergen Reactions  . Penicillins     REACTION: rash   History   Social History  . Marital Status: Married    Spouse Name: N/A    Number of Children: 1  . Years of Education: N/A   Occupational History  . REAL ESTATE DEVELOPER     [employer]   Social History Main Topics  . Smoking status: Former Smoker -- 1.0 packs/day for 2 years    Types: Cigarettes    Quit date: 03/09/1944  . Smokeless tobacco: Never Used  . Alcohol Use: No  . Drug Use: No  . Sexually Active: Not on file   Other Topics Concern  . Not on file   Social History Narrative   Remains very active. 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   No family history on file.  Medications: Current Outpatient Prescriptions  Medication Sig Dispense Refill  . doxepin (SINEQUAN) 25 MG capsule Take 50 mg by mouth daily.       Marland Kitchen dutasteride (AVODART) 0.5 MG capsule Take 0.5 mg by mouth daily.        . flurazepam (DALMANE) 15 MG capsule Take 15 mg by mouth at bedtime as needed.        Marland Kitchen lisinopril (PRINIVIL,ZESTRIL) 10 MG tablet Take 10 mg by mouth daily.      . polyethylene glycol (MIRALAX / GLYCOLAX) packet Take 17 g by mouth daily.      . propranolol (INDERAL LA) 60 MG 24 hr capsule Take 60 mg by mouth daily.        . sildenafil (VIAGRA) 50 MG tablet Take 50 mg by mouth daily as needed.        . silodosin (RAPAFLO) 8 MG CAPS capsule Take 8 mg by mouth daily with breakfast.      . warfarin (COUMADIN) 5 MG tablet Take 5 mg by mouth as directed.          ROS: No nausea or vomiting. No fever or  chills.No melena or hematochezia.No bleeding.No claudication  Physical Exam: BP 147/74  Pulse 65  Ht 6' (1.829 m)  Wt 189 lb (85.73 kg)  BMI 25.63 kg/m2 General: Well-nourished white male in no distress Neck: Normal carotid upstroke no carotid bruit. No thyromegaly nonnodular thyroid. JVP 6 cm Lungs: Clear breath sounds bilaterally. No wheezing Cardiac: Regular rate and rhythm with normal S1-S2. No murmur rubs or gallops Vascular: No edema. Normal distal pulses Skin: Warm and dry. Physcologic: Normal affect  12lead ECG: Normal sinus rhythm by single lead tracing. Limited bedside ECHO:N/A No images are attached to the encounter.   Assessment and Plan  Mild carotid artery disease No significant carotid artery disease by recent Dopplers. Continue risk factor modification. LDL was 98  Atypical chest pain Patient reports no angina. Had a Cardiolite in 2009 which was negative for ischemia. Continue current medical therapy. No further ischemia workup necessary  ATRIAL  FIBRILLATION Patient noticed while he was in [place] and exercising in the gym but his heart rate would go up to 220 beats per minute period. However this was on the monitor with no EKG tracing. I suspect this was a falsely high an erroneous heart rate. He was asymptomatic with this. Clinically the patient has remained in normal sinus rhythm. Single the tracing shows normal sinus rhythm today    Patient Active Problem List  Diagnoses  . ESSENTIAL HYPERTENSION, BENIGN  . ATRIAL FIBRILLATION  . Atypical chest pain  . Mild carotid artery disease

## 2011-06-13 NOTE — Patient Instructions (Signed)
Continue all current medications. Your physician wants you to follow up in: 6 months.  You will receive a reminder letter in the mail one-two months in advance.  If you don't receive a letter, please call our office to schedule the follow up appointment   

## 2011-06-13 NOTE — Assessment & Plan Note (Signed)
No significant carotid artery disease by recent Dopplers. Continue risk factor modification. LDL was 98

## 2011-06-13 NOTE — Assessment & Plan Note (Signed)
Patient noticed while he was in [place] and exercising in the gym but his heart rate would go up to 220 beats per minute period. However this was on the monitor with no EKG tracing. I suspect this was a falsely high an erroneous heart rate. He was asymptomatic with this. Clinically the patient has remained in normal sinus rhythm. Single the tracing shows normal sinus rhythm today

## 2011-06-13 NOTE — Assessment & Plan Note (Signed)
Patient reports no angina. Had a Cardiolite in 2009 which was negative for ischemia. Continue current medical therapy. No further ischemia workup necessary

## 2011-12-05 ENCOUNTER — Other Ambulatory Visit: Payer: Self-pay | Admitting: Dermatology

## 2012-04-23 ENCOUNTER — Other Ambulatory Visit: Payer: Self-pay | Admitting: Dermatology

## 2012-09-04 ENCOUNTER — Encounter: Payer: Self-pay | Admitting: Cardiology

## 2012-09-05 ENCOUNTER — Ambulatory Visit (INDEPENDENT_AMBULATORY_CARE_PROVIDER_SITE_OTHER): Payer: Medicare Other | Admitting: Cardiology

## 2012-09-05 ENCOUNTER — Encounter: Payer: Self-pay | Admitting: Cardiology

## 2012-09-05 VITALS — BP 146/75 | HR 56 | Ht 72.0 in | Wt 190.0 lb

## 2012-09-05 DIAGNOSIS — I4891 Unspecified atrial fibrillation: Secondary | ICD-10-CM

## 2012-09-05 DIAGNOSIS — I10 Essential (primary) hypertension: Secondary | ICD-10-CM

## 2012-09-05 NOTE — Progress Notes (Signed)
   Clinical Summary Antonio Moreno is an 79 y.o.male presenting for office followup. He is a former patient of Dr. Andee Lineman, last seen in May 2013. He remains very active, still developing real estate. Reports fairly long hours. He does tell me he plans to cut back however.  He has occasional palpitations, denies any prolonged symptoms however, no cardiac hospitalizations. Reports compliance with his medications, no major bleeding problems on Coumadin. He has not been experiencing any regular chest pain symptoms. No focal motor weakness, memory problems, or speech deficits. Has history of mild carotid atherosclerosis.   Allergies  Allergen Reactions  . Penicillins     REACTION: rash    Current Outpatient Prescriptions  Medication Sig Dispense Refill  . clonazePAM (KLONOPIN) 0.25 MG disintegrating tablet Take 0.25 mg by mouth at bedtime as needed.      . doxepin (SINEQUAN) 25 MG capsule Take 50 mg by mouth daily.       Marland Kitchen dutasteride (AVODART) 0.5 MG capsule Take 0.5 mg by mouth daily.        Marland Kitchen lisinopril (PRINIVIL,ZESTRIL) 10 MG tablet Take 10 mg by mouth daily.      . polyethylene glycol (MIRALAX / GLYCOLAX) packet Take 17 g by mouth daily as needed.       . propranolol (INDERAL LA) 60 MG 24 hr capsule Take 60 mg by mouth daily.        . sildenafil (VIAGRA) 50 MG tablet Take 50 mg by mouth daily as needed.        . silodosin (RAPAFLO) 8 MG CAPS capsule Take 8 mg by mouth daily with breakfast.      . warfarin (COUMADIN) 5 MG tablet Take 5 mg by mouth as directed.         No current facility-administered medications for this visit.    Past Medical History  Diagnosis Date  . PAF (paroxysmal atrial fibrillation)     On Coumadin  . Colon polyps   . Internal hemorrhoids   . Mild carotid artery disease     Carotid Dopplers 0-39% stenosis bilaterally March 2013   . Atypical chest pain     Cardiolite 2009 without ischemia     Social History Antonio Moreno reports that he quit smoking about 68  years ago. His smoking use included Cigarettes. He has a 2 pack-year smoking history. He has never used smokeless tobacco. Antonio Moreno reports that he does not drink alcohol.  Review of Systems No unusual breathlessness, no orthopnea or PND. No claudication. No peripheral edema.  Physical Examination Filed Vitals:   09/05/12 1453  BP: 146/75  Pulse: 56   Filed Weights   09/05/12 1453  Weight: 190 lb (86.183 kg)   Appears comfortable at rest. HEENT: Conjunctiva and lids normal, oropharynx clear. Neck: Supple, no elevated JVP, no thyromegaly. Lungs: Clear to auscultation, nonlabored breathing at rest. Cardiac: Regular rate and rhythm, soft systolic murmur, no pericardial rub. Abdomen: Soft, nontender, bowel sounds present. Extremities: No pitting edema, distal pulses 2+.   Problem List and Plan   Atrial fibrillation Paroxysmal, no progressive symptomatology. Continue current regimen including beta blocker and Coumadin. Six-month followup arranged.  ESSENTIAL HYPERTENSION, BENIGN We keep regular followup with Dr. Sherril Croon. No change to current regimen.    Jonelle Sidle, M.D., F.A.C.C.

## 2012-09-05 NOTE — Assessment & Plan Note (Signed)
We keep regular followup with Dr. Sherril Croon. No change to current regimen.

## 2012-09-05 NOTE — Assessment & Plan Note (Signed)
Paroxysmal, no progressive symptomatology. Continue current regimen including beta blocker and Coumadin. Six-month followup arranged.

## 2012-09-05 NOTE — Patient Instructions (Addendum)

## 2012-09-11 ENCOUNTER — Other Ambulatory Visit: Payer: Self-pay

## 2012-10-15 ENCOUNTER — Ambulatory Visit (INDEPENDENT_AMBULATORY_CARE_PROVIDER_SITE_OTHER): Payer: Medicare Other | Admitting: Urology

## 2012-10-15 DIAGNOSIS — N400 Enlarged prostate without lower urinary tract symptoms: Secondary | ICD-10-CM

## 2012-12-12 ENCOUNTER — Other Ambulatory Visit: Payer: Self-pay

## 2012-12-24 ENCOUNTER — Other Ambulatory Visit: Payer: Self-pay | Admitting: Dermatology

## 2013-05-19 ENCOUNTER — Other Ambulatory Visit: Payer: Self-pay | Admitting: *Deleted

## 2013-05-19 DIAGNOSIS — I739 Peripheral vascular disease, unspecified: Principal | ICD-10-CM

## 2013-05-19 DIAGNOSIS — I779 Disorder of arteries and arterioles, unspecified: Secondary | ICD-10-CM

## 2013-06-11 ENCOUNTER — Encounter (INDEPENDENT_AMBULATORY_CARE_PROVIDER_SITE_OTHER): Payer: Medicare Other

## 2013-06-11 DIAGNOSIS — I779 Disorder of arteries and arterioles, unspecified: Secondary | ICD-10-CM

## 2013-06-11 DIAGNOSIS — I6529 Occlusion and stenosis of unspecified carotid artery: Secondary | ICD-10-CM

## 2013-06-11 DIAGNOSIS — I739 Peripheral vascular disease, unspecified: Principal | ICD-10-CM

## 2013-06-12 ENCOUNTER — Telehealth: Payer: Self-pay | Admitting: *Deleted

## 2013-06-12 NOTE — Telephone Encounter (Signed)
Message copied by Merlene Laughter on Thu Jun 12, 2013  2:27 PM ------      Message from: MCDOWELL, Aloha Gell      Created: Thu Jun 12, 2013 10:37 AM       Reviewed. Please let him know results are overall reassuring at this point. Stable 1-39% bilateral ICA stenosis. ------

## 2013-06-13 NOTE — Telephone Encounter (Signed)
Notes Recorded by Laurine Blazer, LPN on 07/08/7033 at 0:09 PM Patient notified and verbalized understanding. Has follow up with Dr. Domenic Polite on 06/26/2013.

## 2013-06-13 NOTE — Telephone Encounter (Signed)
Returned call from 5/7

## 2013-06-26 ENCOUNTER — Encounter: Payer: Self-pay | Admitting: Cardiology

## 2013-06-26 ENCOUNTER — Ambulatory Visit (INDEPENDENT_AMBULATORY_CARE_PROVIDER_SITE_OTHER): Payer: Medicare Other | Admitting: Cardiology

## 2013-06-26 VITALS — BP 136/64 | HR 60 | Ht 72.0 in | Wt 194.0 lb

## 2013-06-26 DIAGNOSIS — I480 Paroxysmal atrial fibrillation: Secondary | ICD-10-CM

## 2013-06-26 DIAGNOSIS — I739 Peripheral vascular disease, unspecified: Secondary | ICD-10-CM

## 2013-06-26 DIAGNOSIS — I10 Essential (primary) hypertension: Secondary | ICD-10-CM

## 2013-06-26 DIAGNOSIS — I779 Disorder of arteries and arterioles, unspecified: Secondary | ICD-10-CM

## 2013-06-26 DIAGNOSIS — I4891 Unspecified atrial fibrillation: Secondary | ICD-10-CM

## 2013-06-26 NOTE — Assessment & Plan Note (Signed)
Symptomatically doing well. Will obtain interval ECG from Dr. Woody Seller. Continues on Coumadin and propranolol.

## 2013-06-26 NOTE — Progress Notes (Signed)
    Clinical Summary Mr. Studley is a 80 y.o.male last seen in July 2014. 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 He reports only very infrequent feelings of palpitations, more specifically a forceful heartbeat at times. He denies any bleeding problems with Coumadin.   Carotid Dopplers done just recently showed stable 1 to 39% bilateral ICA stenoses. We discussed this today. He denies any speech deficits, focal motor weakness.  He states that he did have an interval ECG with Dr. Woody Seller - plan to obtain for our records.   Allergies  Allergen Reactions  . Penicillins     REACTION: rash    Current Outpatient Prescriptions  Medication Sig Dispense Refill  . clonazePAM (KLONOPIN) 0.25 MG disintegrating tablet Take 0.25 mg by mouth at bedtime as needed.      . doxepin (SINEQUAN) 25 MG capsule Take 50 mg by mouth daily.       Marland Kitchen dutasteride (AVODART) 0.5 MG capsule Take 0.5 mg by mouth daily.        Marland Kitchen lisinopril (PRINIVIL,ZESTRIL) 10 MG tablet Take 10 mg by mouth daily.      . propranolol (INDERAL LA) 60 MG 24 hr capsule Take 60 mg by mouth daily.        . sildenafil (VIAGRA) 50 MG tablet Take 50 mg by mouth daily as needed.        . silodosin (RAPAFLO) 8 MG CAPS capsule Take 8 mg by mouth daily with breakfast.      . warfarin (COUMADIN) 5 MG tablet Take 5 mg by mouth as directed.         No current facility-administered medications for this visit.    Past Medical History  Diagnosis Date  . PAF (paroxysmal atrial fibrillation)     On Coumadin  . Colon polyps   . Internal hemorrhoids   . Mild carotid artery disease     Carotid Dopplers 0-39% stenosis bilaterally March 2013   . Atypical chest pain     Cardiolite 2009 without ischemia     Social History Mr. Gatliff reports that he quit smoking about 69 years ago. His smoking use included Cigarettes. He has a 2 pack-year smoking history. He has never used smokeless tobacco. Mr. Leveque reports that he does  not drink alcohol.  Review of Systems No chest pain or unusual breathlessness. Otherwise as outlined  Physical Examination Filed Vitals:   06/26/13 1318  BP: 136/64  Pulse: 60   Filed Weights   06/26/13 1318  Weight: 194 lb (87.998 kg)    Appears comfortable at rest.  HEENT: Conjunctiva and lids normal, oropharynx clear.  Neck: Supple, no elevated JVP, no thyromegaly.  Lungs: Clear to auscultation, nonlabored breathing at rest.  Cardiac: Regular rate and rhythm, soft systolic murmur, no pericardial rub.  Abdomen: Soft, nontender, bowel sounds present.  Extremities: No pitting edema, distal pulses 2+.   Problem List and Plan   Paroxysmal atrial fibrillation Symptomatically doing well. Will obtain interval ECG from Dr. Woody Seller. Continues on Coumadin and propranolol.  Essential hypertension, benign No change to current regimen.  Mild carotid artery disease Stable by recent carotid Dopplers.    Satira Sark, M.D., F.A.C.C.

## 2013-06-26 NOTE — Assessment & Plan Note (Signed)
Stable by recent carotid Dopplers.

## 2013-06-26 NOTE — Assessment & Plan Note (Signed)
No change to current regimen. 

## 2013-06-26 NOTE — Patient Instructions (Signed)
Your physician recommends that you schedule a follow-up appointment in: 8 months. You will receive a reminder letter in the mail in about 6 months reminding you to call and schedule your appointment. If you don't receive this letter, please contact our office. Your physician recommends that you continue on your current medications as directed. Please refer to the Current Medication list given to you today.

## 2013-09-18 ENCOUNTER — Encounter (INDEPENDENT_AMBULATORY_CARE_PROVIDER_SITE_OTHER): Payer: Self-pay | Admitting: *Deleted

## 2013-10-21 ENCOUNTER — Telehealth (INDEPENDENT_AMBULATORY_CARE_PROVIDER_SITE_OTHER): Payer: Self-pay | Admitting: *Deleted

## 2013-10-21 ENCOUNTER — Ambulatory Visit (INDEPENDENT_AMBULATORY_CARE_PROVIDER_SITE_OTHER): Payer: Medicare Other | Admitting: Internal Medicine

## 2013-10-21 ENCOUNTER — Other Ambulatory Visit (INDEPENDENT_AMBULATORY_CARE_PROVIDER_SITE_OTHER): Payer: Self-pay | Admitting: *Deleted

## 2013-10-21 ENCOUNTER — Encounter (INDEPENDENT_AMBULATORY_CARE_PROVIDER_SITE_OTHER): Payer: Self-pay | Admitting: Internal Medicine

## 2013-10-21 VITALS — BP 154/70 | HR 76 | Temp 97.7°F | Ht 72.0 in | Wt 192.8 lb

## 2013-10-21 DIAGNOSIS — Z1211 Encounter for screening for malignant neoplasm of colon: Secondary | ICD-10-CM

## 2013-10-21 DIAGNOSIS — K5900 Constipation, unspecified: Secondary | ICD-10-CM | POA: Insufficient documentation

## 2013-10-21 NOTE — Telephone Encounter (Signed)
Patient needs movi prep 

## 2013-10-21 NOTE — Progress Notes (Signed)
Subjective:    Patient ID: Antonio Moreno, male    DOB: [birth year], 80 y.o.   MRN: [id number]  HPI Referred to our office by Dr. Woody Seller for screening colonoscopy.  He tells me for 2-3 yrs his stools have been hard and painful. Sometimes his stools are small, and looks like rabbit droppings. He says MOM will help with his constipation.  He usually has a BM every other day. He says when he has a BM, it is so painful, it is discouraging.  He say he has tried just about every thing which has not helped.  He says he has tried Linzess but  his caused diarrhea and he stopped.  He has also tried Miralax, and Metamucil which helped temporarily.  His last colonoscopy by Dr. Lindalou Hose ? Which was normal.  09/02/2013 H andH 14.2 and 42.5, Platelet ct 158, Total bili 0.5, ALP 70, ASt 18, ALT 15  5/25/2012ERCP with Spincterotomy Dr. Carlean Purl: [facility] [place].   07/01/2010 Cholecystectomy: Georganna Skeans PREOPERATIVE DIAGNOSIS: Symptomatic cholelithiasis.  POSTOPERATIVE DIAGNOSES:  1. Symptomatic cholelithiasis.  2. Choledocholithiasis.   Review of Systems     Past Medical History  Diagnosis Date  . PAF (paroxysmal atrial fibrillation)     On Coumadin  . Colon polyps   . Internal hemorrhoids   . Mild carotid artery disease     Carotid Dopplers 0-39% stenosis bilaterally March 2013   . Atypical chest pain     Cardiolite 2009 without ischemia     Past Surgical History  Procedure Laterality Date  . Abcess of right thigh    . Lumbar laminectomy      Allergies  Allergen Reactions  . Penicillins     REACTION: rash    Current Outpatient Prescriptions on File Prior to Visit  Medication Sig Dispense Refill  . clonazePAM (KLONOPIN) 0.25 MG disintegrating tablet Take 0.25 mg by mouth at bedtime as needed.      . doxepin (SINEQUAN) 25 MG capsule Take 50 mg by mouth daily.       Marland Kitchen dutasteride (AVODART) 0.5 MG capsule Take 0.5 mg by mouth daily.        Marland Kitchen lisinopril (PRINIVIL,ZESTRIL) 10 MG tablet  Take 10 mg by mouth daily.      . propranolol (INDERAL LA) 60 MG 24 hr capsule Take 60 mg by mouth daily.        . sildenafil (VIAGRA) 50 MG tablet Take 50 mg by mouth daily as needed.        . silodosin (RAPAFLO) 8 MG CAPS capsule Take 8 mg by mouth daily with breakfast.      . warfarin (COUMADIN) 5 MG tablet Take 5 mg by mouth as directed.         No current facility-administered medications on file prior to visit.     Objective:   Physical Exam  Filed Vitals:   10/21/13 1005  BP: 154/70  Pulse: 76  Temp: 97.7 F (36.5 C)  Height: 6' (1.829 m)  Weight: 192 lb 12.8 oz (87.454 kg)   Alert and oriented. Skin warm and dry. Oral mucosa is moist.   . Sclera anicteric, conjunctivae is pink. Thyroid not enlarged. No cervical lymphadenopathy. Lungs clear. Heart regular rate and rhythm.  Abdomen is soft. Bowel sounds are positive. No hepatomegaly. No abdominal masses felt. No tenderness.  No edema to lower extremities.          Assessment & Plan:  Colonoscopy. The risks and benefits such as  perforation, bleeding, and infection were reviewed with the patient and is agreeable. Metamucil daily.

## 2013-10-21 NOTE — Patient Instructions (Signed)
Colonoscopy. The risks and benefits such as perforation, bleeding, and infection were reviewed with the patient and is agreeable. Metamucil.

## 2013-10-22 MED ORDER — PEG-KCL-NACL-NASULF-NA ASC-C 100 G PO SOLR
1.0000 | Freq: Once | ORAL | Status: DC
Start: 2013-10-22 — End: 2013-11-04

## 2013-11-04 ENCOUNTER — Encounter (HOSPITAL_COMMUNITY): Payer: Self-pay | Admitting: Pharmacy Technician

## 2013-11-18 ENCOUNTER — Encounter (HOSPITAL_COMMUNITY)
Admission: RE | Disposition: A | Discharge status: Home / Self Care | Payer: Self-pay | Source: Ambulatory Visit | Attending: Internal Medicine

## 2013-11-18 ENCOUNTER — Encounter (HOSPITAL_COMMUNITY): Payer: Self-pay

## 2013-11-18 ENCOUNTER — Ambulatory Visit (HOSPITAL_COMMUNITY)
Admission: RE | Admit: 2013-11-18 | Discharge: 2013-11-18 | Disposition: A | Discharge status: Home / Self Care | Payer: Medicare Other | Source: Ambulatory Visit | Attending: Internal Medicine | Admitting: Internal Medicine

## 2013-11-18 DIAGNOSIS — K649 Unspecified hemorrhoids: Secondary | ICD-10-CM

## 2013-11-18 DIAGNOSIS — D122 Benign neoplasm of ascending colon: Secondary | ICD-10-CM

## 2013-11-18 DIAGNOSIS — D120 Benign neoplasm of cecum: Secondary | ICD-10-CM

## 2013-11-18 DIAGNOSIS — K5730 Diverticulosis of large intestine without perforation or abscess without bleeding: Secondary | ICD-10-CM | POA: Diagnosis not present

## 2013-11-18 DIAGNOSIS — Z86010 Personal history of colonic polyps: Secondary | ICD-10-CM | POA: Insufficient documentation

## 2013-11-18 DIAGNOSIS — I480 Paroxysmal atrial fibrillation: Secondary | ICD-10-CM | POA: Insufficient documentation

## 2013-11-18 DIAGNOSIS — Z87891 Personal history of nicotine dependence: Secondary | ICD-10-CM | POA: Diagnosis not present

## 2013-11-18 DIAGNOSIS — K644 Residual hemorrhoidal skin tags: Secondary | ICD-10-CM | POA: Insufficient documentation

## 2013-11-18 DIAGNOSIS — K5900 Constipation, unspecified: Secondary | ICD-10-CM

## 2013-11-18 DIAGNOSIS — Z7901 Long term (current) use of anticoagulants: Secondary | ICD-10-CM | POA: Insufficient documentation

## 2013-11-18 DIAGNOSIS — K5731 Diverticulosis of large intestine without perforation or abscess with bleeding: Secondary | ICD-10-CM

## 2013-11-18 DIAGNOSIS — Z800 Family history of malignant neoplasm of digestive organs: Secondary | ICD-10-CM | POA: Insufficient documentation

## 2013-11-18 HISTORY — PX: COLONOSCOPY: SHX5424

## 2013-11-18 SURGERY — COLONOSCOPY
Anesthesia: Moderate Sedation

## 2013-11-18 MED ORDER — MIDAZOLAM HCL 5 MG/5ML IJ SOLN
INTRAMUSCULAR | Status: AC
  Filled 2013-11-18: qty 10

## 2013-11-18 MED ORDER — STERILE WATER FOR IRRIGATION IR SOLN
Status: DC | PRN
  Administered 2013-11-18: 08:00:00

## 2013-11-18 MED ORDER — MEPERIDINE HCL 50 MG/ML IJ SOLN
INTRAMUSCULAR | Status: AC
  Filled 2013-11-18: qty 1

## 2013-11-18 MED ORDER — MIDAZOLAM HCL 5 MG/5ML IJ SOLN
INTRAMUSCULAR | Status: DC | PRN
Start: 2013-11-18 — End: 2013-11-18
  Administered 2013-11-18: 2 mg via INTRAVENOUS
  Administered 2013-11-18 (×2): 1 mg via INTRAVENOUS

## 2013-11-18 MED ORDER — MEPERIDINE HCL 50 MG/ML IJ SOLN
INTRAMUSCULAR | Status: DC | PRN
  Administered 2013-11-18: 20 mg via INTRAVENOUS

## 2013-11-18 MED ORDER — SODIUM CHLORIDE 0.9 % IV SOLN
INTRAVENOUS | Status: DC
  Administered 2013-11-18: 07:00:00 via INTRAVENOUS

## 2013-11-18 MED ORDER — DOCUSATE SODIUM 100 MG PO CAPS: 200.0000 mg | ORAL_CAPSULE | Freq: Every day | ORAL | Status: DC

## 2013-11-18 NOTE — Op Note (Signed)
COLONOSCOPY PROCEDURE REPORT  PATIENT:  Antonio Moreno  MR#:  [id number] Birthdate:  20-Feb-1920, 80 y.o., male Endoscopist:  Dr. Rogene Houston, MD Referred By:  Dr. Glenda Chroman, MD Procedure Date: 11/18/2013  Procedure:   Colonoscopy  Indications:  Patient is a 80 year old Caucasian male who presents with progressive constipation, intermittent hematochezia and painful defecation. Family history significant for colon carcinoma in sister, maternal grandmother and maternal aunt late onset. Last colonoscopy was 7 years ago.  Informed Consent:  The procedure and risks were reviewed with the patient and informed consent was obtained.  Medications:  Demerol 20 mg IV Versed 4 mg IV  Description of procedure:  After a digital rectal exam was performed, that colonoscope was advanced from the anus through the rectum and colon to the area of the cecum, ileocecal valve and appendiceal orifice. The cecum was deeply intubated. These structures were well-seen and photographed for the record. From the level of the cecum and ileocecal valve, the scope was slowly and cautiously withdrawn. The mucosal surfaces were carefully surveyed utilizing scope tip to flexion to facilitate fold flattening as needed. The scope was pulled down into the rectum where a thorough exam including retroflexion was performed.  Findings:   Prep satisfactory. Three small polyps were ablated via cold biopsy and submitted together. One was located at cecum and the other stool in ascending colon. Moderate number of diverticula in sigmoid colon. Normal rectal mucosa. Small hemorrhoids below the dentate line.   Therapeutic/Diagnostic Maneuvers Performed:  See above  Complications:  None  Cecal Withdrawal Time:  12 minutes  Impression:  Examination performed to cecum. Three small polyps ablated via cold biopsy and submitted together(cecum and ascending colon). Sigmoid colon diverticulosis. Small external  hemorrhoids.  Recommendations:  Standard instructions given. Resume warfarin at usual dose. High fiber diet. Fiber supplement 3-4 g by mouth twice daily. Colace 20 mg by mouth each bedtime. Stool diary for 4 weeks. I will contact patient with biopsy results and further recommendations.  Neelam Tiggs U  11/18/2013 8:17 AM  CC: Dr. Glenda Chroman., MD & Dr. Rayne Du ref. provider found

## 2013-11-18 NOTE — Discharge Instructions (Signed)
Resume usual medications including warfarin.INR check in 7-10 days. High fiber diet. Colace(stool softener) 200 mg by mouth daily at bedtime Fiber supplement 3-4 g by mouth twice daily. No driving for 24 hours. Physician will call with biopsy results. Stool diary for 4 weeks and send [place] the summary.  High-Fiber Diet Fiber is found in fruits, vegetables, and grains. A high-fiber diet encourages the addition of more whole grains, legumes, fruits, and vegetables in your diet. The recommended amount of fiber for adult males is 38 g per day. For adult females, it is 25 g per day. Pregnant and lactating women should get 28 g of fiber per day. If you have a digestive or bowel problem, ask your caregiver for advice before adding high-fiber foods to your diet. Eat a variety of high-fiber foods instead of only a select few type of foods.  PURPOSE  To increase stool bulk.  To make bowel movements more regular to prevent constipation.  To lower cholesterol.  To prevent overeating. WHEN IS THIS DIET USED?  It may be used if you have constipation and hemorrhoids.  It may be used if you have uncomplicated diverticulosis (intestine condition) and irritable bowel syndrome.  It may be used if you need help with weight management.  It may be used if you want to add it to your diet as a protective measure against atherosclerosis, diabetes, and cancer. SOURCES OF FIBER  Whole-grain breads and cereals.  Fruits, such as apples, oranges, bananas, berries, prunes, and pears.  Vegetables, such as green peas, carrots, sweet potatoes, beets, broccoli, cabbage, spinach, and artichokes.  Legumes, such split peas, soy, lentils.  Almonds. FIBER CONTENT IN FOODS Starches and Grains / Dietary Fiber (g)  Cheerios, 1 cup / 3 g  Corn Flakes cereal, 1 cup / 0.7 g  Rice crispy treat cereal, 1 cup / 0.3 g  Instant oatmeal (cooked),  cup / 2 g  Frosted wheat cereal, 1 cup / 5.1 g  Brown, long-grain rice  (cooked), 1 cup / 3.5 g  White, long-grain rice (cooked), 1 cup / 0.6 g  Enriched macaroni (cooked), 1 cup / 2.5 g Legumes / Dietary Fiber (g)  Baked beans (canned, plain, or vegetarian),  cup / 5.2 g  Kidney beans (canned),  cup / 6.8 g  Pinto beans (cooked),  cup / 5.5 g Breads and Crackers / Dietary Fiber (g)  Plain or honey graham crackers, 2 squares / 0.7 g  Saltine crackers, 3 squares / 0.3 g  Plain, salted pretzels, 10 pieces / 1.8 g  Whole-wheat bread, 1 slice / 1.9 g  White bread, 1 slice / 0.7 g  Raisin bread, 1 slice / 1.2 g  Plain bagel, 3 oz / 2 g  Flour tortilla, 1 oz / 0.9 g  Corn tortilla, 1 small / 1.5 g  Hamburger or hotdog bun, 1 small / 0.9 g Fruits / Dietary Fiber (g)  Apple with skin, 1 medium / 4.4 g  Sweetened applesauce,  cup / 1.5 g  Banana,  medium / 1.5 g  Grapes, 10 grapes / 0.4 g  Orange, 1 small / 2.3 g  Raisin, 1.5 oz / 1.6 g  Melon, 1 cup / 1.4 g Vegetables / Dietary Fiber (g)  Green beans (canned),  cup / 1.3 g  Carrots (cooked),  cup / 2.3 g  Broccoli (cooked),  cup / 2.8 g  Peas (cooked),  cup / 4.4 g  Mashed potatoes,  cup / 1.6 g  Lettuce,  1 cup / 0.5 g  Corn (canned),  cup / 1.6 g  Tomato,  cup / 1.1 g Document Released: 01/23/2005 Document Revised: 07/25/2011 Document Reviewed: 04/27/2011 [facility] Patient Information 2015 [place], [place]. This information is not intended to replace advice given to you by your health care provider. Make sure you discuss any questions you have with your health care provider. Colonoscopy, Care After Refer to this sheet in the next few weeks. These instructions provide you with information on caring for yourself after your procedure. Your health care provider may also give you more specific instructions. Your treatment has been planned according to current medical practices, but problems sometimes occur. Call your health care provider if you have any problems or  questions after your procedure. WHAT TO EXPECT AFTER THE PROCEDURE  After your procedure, it is typical to have the following:  A small amount of blood in your stool.  Moderate amounts of gas and mild abdominal cramping or bloating. HOME CARE INSTRUCTIONS  Do not drive, operate machinery, or sign important documents for 24 hours.  You may shower and resume your regular physical activities, but move at a slower pace for the first 24 hours.  Take frequent rest periods for the first 24 hours.  Walk around or put a warm pack on your abdomen to help reduce abdominal cramping and bloating.  Drink enough fluids to keep your urine clear or pale yellow.  You may resume your normal diet as instructed by your health care provider. Avoid heavy or fried foods that are hard to digest.  Avoid drinking alcohol for 24 hours or as instructed by your health care provider.  Only take over-the-counter or prescription medicines as directed by your health care provider.  If a tissue sample (biopsy) was taken during your procedure:  Do not take aspirin or blood thinners for 7 days, or as instructed by your health care provider.  Do not drink alcohol for 7 days, or as instructed by your health care provider.  Eat soft foods for the first 24 hours. SEEK MEDICAL CARE IF: You have persistent spotting of blood in your stool 2-3 days after the procedure. SEEK IMMEDIATE MEDICAL CARE IF:  You have more than a small spotting of blood in your stool.  You pass large blood clots in your stool.  Your abdomen is swollen (distended).  You have nausea or vomiting.  You have a fever.  You have increasing abdominal pain that is not relieved with medicine. Document Released: 09/07/2003 Document Revised: 11/13/2012 Document Reviewed: 09/30/2012 [facility] Patient Information 2015 [place], [place]. This information is not intended to replace advice given to you by your health care provider. Make sure you discuss any  questions you have with your health care provider.

## 2013-11-18 NOTE — H&P (Addendum)
Antonio Moreno is an 80 y.o. male.   Chief Complaint: Patient is here for colonoscopy. HPI: Patient is a 80 year old Caucasian male who presents with a three-year history of constipation which has been progressive. He has had increasing difficulty in the last 8 months. He believes he is doing better with fiber supplement. He has tried Linzess and polyethylene glycol. Lately he has noted hematochezia; stool is hard and defecation is painful. He denies abdominal pain anorexia or weight loss. Family history is positive for colon carcinoma in a sister who was diagnosed at 8 and died of metastatic disease at 64. Maternal grandmother also had colon cancer in her 58s and maternal aunt also had colon carcinoma in later years. Last colonoscopy was 7 years ago. Patient has been off warfarin for 5 days.  Past Medical History  Diagnosis Date  . PAF (paroxysmal atrial fibrillation)     On Coumadin  . Colon polyps   . Internal hemorrhoids   . Mild carotid artery disease     Carotid Dopplers 0-39% stenosis bilaterally March 2013   . Atypical chest pain     Cardiolite 2009 without ischemia     Past Surgical History  Procedure Laterality Date  . Abcess of right thigh    . Lumbar laminectomy    . Cholecystectomy      History reviewed. No pertinent family history. Social History:  reports that he quit smoking about 69 years ago. His smoking use included Cigarettes. He has a 2 pack-year smoking history. He has never used smokeless tobacco. He reports that he does not drink alcohol or use illicit drugs.  Allergies:  Allergies  Allergen Reactions  . Penicillins     REACTION: rash    Medications Prior to Admission  Medication Sig Dispense Refill  . clonazePAM (KLONOPIN) 0.25 MG disintegrating tablet Take 0.25 mg by mouth at bedtime as needed.      . doxepin (SINEQUAN) 25 MG capsule Take 50 mg by mouth daily.       Marland Kitchen dutasteride (AVODART) 0.5 MG capsule Take 0.5 mg by mouth daily.        Marland Kitchen  lisinopril (PRINIVIL,ZESTRIL) 10 MG tablet Take 10 mg by mouth daily.      . propranolol (INDERAL LA) 60 MG 24 hr capsule Take 60 mg by mouth daily.        . sildenafil (VIAGRA) 50 MG tablet Take 50 mg by mouth daily as needed.        . silodosin (RAPAFLO) 8 MG CAPS capsule Take 8 mg by mouth daily with breakfast.      . warfarin (COUMADIN) 5 MG tablet Take 5 mg by mouth as directed.          No results found for this or any previous visit (from the past 48 hour(s)). No results found.  ROS  Blood pressure 144/66, pulse 57, temperature 97.8 F (36.6 C), temperature source Oral, resp. rate 14, SpO2 96.00%. Physical Exam  Constitutional: He appears well-developed and well-nourished.  HENT:  Mouth/Throat: Oropharynx is clear and moist.  Eyes: Conjunctivae are normal. No scleral icterus.  Neck: No thyromegaly present.  Cardiovascular:  Irregular rhythm normal S1 and S2. No murmur or gallop noted.  Respiratory: Effort normal and breath sounds normal.  GI: Soft. He exhibits no distension and no mass. There is no tenderness.  Musculoskeletal: Edema:  trace edema around ankles.  Lymphadenopathy:    He has no cervical adenopathy.  Neurological: He is alert.  Skin: Skin is  warm and dry.     Assessment/Plan Change in bowel habit habits. Family history of colon carcinoma. Diagnostic colonoscopy.  Antinette Keough U 11/18/2013, 7:35 AM

## 2013-11-19 ENCOUNTER — Encounter (HOSPITAL_COMMUNITY): Payer: Self-pay | Admitting: Internal Medicine

## 2013-12-04 ENCOUNTER — Encounter (INDEPENDENT_AMBULATORY_CARE_PROVIDER_SITE_OTHER): Payer: Self-pay | Admitting: *Deleted

## 2013-12-16 ENCOUNTER — Ambulatory Visit (INDEPENDENT_AMBULATORY_CARE_PROVIDER_SITE_OTHER): Payer: Medicare Other | Admitting: Urology

## 2013-12-16 DIAGNOSIS — N5201 Erectile dysfunction due to arterial insufficiency: Secondary | ICD-10-CM

## 2013-12-16 DIAGNOSIS — N481 Balanitis: Secondary | ICD-10-CM

## 2013-12-16 DIAGNOSIS — N400 Enlarged prostate without lower urinary tract symptoms: Secondary | ICD-10-CM

## 2014-01-23 ENCOUNTER — Encounter (INDEPENDENT_AMBULATORY_CARE_PROVIDER_SITE_OTHER): Payer: Self-pay

## 2014-01-27 ENCOUNTER — Encounter: Payer: Self-pay | Admitting: Cardiology

## 2014-01-27 ENCOUNTER — Ambulatory Visit (INDEPENDENT_AMBULATORY_CARE_PROVIDER_SITE_OTHER): Payer: Medicare Other | Admitting: Cardiology

## 2014-01-27 VITALS — BP 152/77 | HR 52 | Ht 72.0 in | Wt 191.0 lb

## 2014-01-27 DIAGNOSIS — I10 Essential (primary) hypertension: Secondary | ICD-10-CM

## 2014-01-27 DIAGNOSIS — I739 Peripheral vascular disease, unspecified: Secondary | ICD-10-CM

## 2014-01-27 DIAGNOSIS — I779 Disorder of arteries and arterioles, unspecified: Secondary | ICD-10-CM

## 2014-01-27 DIAGNOSIS — I480 Paroxysmal atrial fibrillation: Secondary | ICD-10-CM

## 2014-01-27 NOTE — Assessment & Plan Note (Signed)
Carotid Dopplers from May 2015 noted above.

## 2014-01-27 NOTE — Assessment & Plan Note (Signed)
Blood pressure is mildly increased today. Keep follow-up with Dr. Woody Seller. He is on lisinopril and Inderal LA. Recommended more regular walking regimen for exercise.

## 2014-01-27 NOTE — Patient Instructions (Signed)

## 2014-01-27 NOTE — Assessment & Plan Note (Signed)
Symptomatically well controlled on current regimen including Inderal LA and Coumadin. No changes were made today. Follow-up in 6 months.Antonio Moreno

## 2014-01-27 NOTE — Progress Notes (Signed)
Reason for visit: PAF  Clinical Summary Antonio Moreno is a 80 y.o.male last seen in May. He presents for a routine visit. Doing well without significant breakthrough palpitations. His ECG today shows sinus bradycardia. 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  Carotid Dopplers from May 2015 showed 1-39% bilateral ICA stenoses. No indication for repeat testing at this time.  Lab work from July showed hemoglobin 14.2, platelets 158, BUN 7, creatinine 0.8, potassium 4.9, normal LFTs, cholesterol 191, triglycerides 103, HDL 54, LDL 116, TSH 1.6. He continues to follow with Dr. Woody Moreno.  Allergies  Allergen Reactions  . Penicillins     REACTION: rash    Current Outpatient Prescriptions  Medication Sig Dispense Refill  . clonazePAM (KLONOPIN) 0.25 MG disintegrating tablet Take 0.25 mg by mouth at bedtime as needed.    . docusate sodium (COLACE) 100 MG capsule Take 2 capsules (200 mg total) by mouth at bedtime. 10 capsule 0  . doxepin (SINEQUAN) 25 MG capsule Take 50 mg by mouth daily.     Marland Kitchen dutasteride (AVODART) 0.5 MG capsule Take 0.5 mg by mouth daily.      Marland Kitchen lisinopril (PRINIVIL,ZESTRIL) 10 MG tablet Take 10 mg by mouth daily.    . propranolol (INDERAL LA) 60 MG 24 hr capsule Take 60 mg by mouth daily.      . sildenafil (VIAGRA) 50 MG tablet Take 50 mg by mouth daily as needed.      . silodosin (RAPAFLO) 8 MG CAPS capsule Take 8 mg by mouth daily with breakfast.    . warfarin (COUMADIN) 5 MG tablet Take 5 mg by mouth as directed.       No current facility-administered medications for this visit.    Past Medical History  Diagnosis Date  . PAF (paroxysmal atrial fibrillation)     On Coumadin  . Colon polyps   . Internal hemorrhoids   . Mild carotid artery disease     1-39% stenosis bilaterally  . Atypical chest pain     Cardiolite 2009 without ischemia     Social History Antonio Moreno reports that he quit smoking  about 69 years ago. His smoking use included Cigarettes. He started smoking about 72 years ago. He has a 2 pack-year smoking history. He has never used smokeless tobacco. Antonio Moreno reports that he does not drink alcohol.  Review of Systems Complete review of systems negative except as otherwise outlined in the clinical summary and also the following. No reported unusual bleeding episodes on Coumadin. Stable appetite.  Physical Examination Filed Vitals:   01/27/14 1105  BP: 152/77  Pulse: 52   Filed Weights   01/27/14 1105  Weight: 191 lb (86.637 kg)    Appears comfortable at rest.  HEENT: Conjunctiva and lids normal, oropharynx clear.  Neck: Supple, no elevated JVP, no thyromegaly.  Lungs: Clear to auscultation, nonlabored breathing at rest.  Cardiac: Regular rate and rhythm, soft systolic murmur, no pericardial rub.  Abdomen: Soft, nontender, bowel sounds present.  Extremities: No pitting edema, distal pulses 2+.   Problem List and Plan   Paroxysmal atrial fibrillation Symptomatically well controlled on current regimen including Inderal LA and Coumadin. No changes were made today. Follow-up in 6 months..  Mild carotid artery disease Carotid Dopplers from May 2015 noted above.  Essential hypertension, benign Blood pressure is mildly increased today. Keep follow-up with Dr. Woody Moreno. He is on lisinopril and Inderal LA. Recommended  more regular walking regimen for exercise.    Antonio Moreno, M.D., F.A.C.C.

## 2014-06-11 ENCOUNTER — Other Ambulatory Visit (HOSPITAL_COMMUNITY): Payer: Self-pay | Admitting: Internal Medicine

## 2014-06-11 DIAGNOSIS — M7989 Other specified soft tissue disorders: Secondary | ICD-10-CM

## 2014-06-12 ENCOUNTER — Ambulatory Visit (HOSPITAL_COMMUNITY)
Admission: RE | Admit: 2014-06-12 | Discharge: 2014-06-12 | Disposition: A | Discharge status: Home / Self Care | Payer: Medicare Other | Source: Ambulatory Visit | Attending: Internal Medicine | Admitting: Internal Medicine

## 2014-06-12 DIAGNOSIS — M7989 Other specified soft tissue disorders: Secondary | ICD-10-CM | POA: Diagnosis present

## 2014-12-30 ENCOUNTER — Ambulatory Visit (INDEPENDENT_AMBULATORY_CARE_PROVIDER_SITE_OTHER): Payer: Medicare Other | Admitting: Cardiology

## 2014-12-30 ENCOUNTER — Encounter: Payer: Self-pay | Admitting: Cardiology

## 2014-12-30 VITALS — BP 117/73 | HR 73 | Ht 72.0 in | Wt 193.8 lb

## 2014-12-30 DIAGNOSIS — I779 Disorder of arteries and arterioles, unspecified: Secondary | ICD-10-CM

## 2014-12-30 DIAGNOSIS — I739 Peripheral vascular disease, unspecified: Secondary | ICD-10-CM

## 2014-12-30 DIAGNOSIS — I480 Paroxysmal atrial fibrillation: Secondary | ICD-10-CM

## 2014-12-30 NOTE — Progress Notes (Signed)
Cardiology Office Note  Date: 12/30/2014   ID: [name], DOB [birth year], MRN [id number]  PCP: Glenda Chroman., MD  Primary Cardiologist: Rozann Lesches, MD   Chief Complaint  Patient presents with  . PAF    History of Present Illness: KRISHANG LOHRMANN is a 81 y.o. male last seen in December 2015. He comes in for a routine follow-up visit. He is still aware of palpitations intermittently, often at nighttime, but there has been no major change in pattern or intensity. He states if he takes a sleeping pill at night he has fewer symptoms. He reports compliance with his medications which are outlined below. No unusual bleeding problems on Coumadin, he continues on propranolol.  He remains active, mentally alert. Still involved in business in the community.   Past Medical History  Diagnosis Date  . PAF (paroxysmal atrial fibrillation) (HCC)     On Coumadin  . Colon polyps   . Internal hemorrhoids   . Mild carotid artery disease (HCC)     1-39% stenosis bilaterally  . Atypical chest pain     Cardiolite 2009 without ischemia     Current Outpatient Prescriptions  Medication Sig Dispense Refill  . clonazePAM (KLONOPIN) 0.25 MG disintegrating tablet Take 0.25 mg by mouth at bedtime as needed.    . doxepin (SINEQUAN) 25 MG capsule Take 50 mg by mouth daily.     Marland Kitchen dutasteride (AVODART) 0.5 MG capsule Take 0.5 mg by mouth daily.      Marland Kitchen lisinopril (PRINIVIL,ZESTRIL) 10 MG tablet Take 10 mg by mouth daily.    . propranolol (INDERAL LA) 60 MG 24 hr capsule Take 60 mg by mouth daily.      . silodosin (RAPAFLO) 8 MG CAPS capsule Take 8 mg by mouth daily with breakfast.    . warfarin (COUMADIN) 5 MG tablet Take 5 mg by mouth as directed.       No current facility-administered medications for this visit.    Allergies:  Penicillins   Social History: The patient  reports that he quit smoking about 70 years ago. His smoking use included Cigarettes. He started smoking about 73 years ago.  He has a 2 pack-year smoking history. He has never used smokeless tobacco. He reports that he does not drink alcohol or use illicit drugs.   ROS:  Please see the history of present illness. Otherwise, complete review of systems is positive for arthritic pains.  All other systems are reviewed and negative.   Physical Exam: VS:  BP 117/73 mmHg  Pulse 73  Ht 6' (1.829 m)  Wt 193 lb 12.8 oz (87.907 kg)  BMI 26.28 kg/m2  SpO2 96%, BMI Body mass index is 26.28 kg/(m^2).  Wt Readings from Last 3 Encounters:  12/30/14 193 lb 12.8 oz (87.907 kg)  01/27/14 191 lb (86.637 kg)  10/21/13 192 lb 12.8 oz (87.454 kg)     Appears comfortable at rest.  HEENT: Conjunctiva and lids normal, oropharynx clear.  Neck: Supple, no elevated JVP, no thyromegaly.  Lungs: Clear to auscultation, nonlabored breathing at rest.  Cardiac: Regular rate and rhythm, soft systolic murmur, no pericardial rub.  Abdomen: Soft, nontender, bowel sounds present.  Extremities: No pitting edema, distal pulses 2+.   ECG: ECG is not ordered today.   Recent Labwork:  Lab work from July 2015 showed hemoglobin 14.2, platelets 158, BUN 7, creatinine 0.8, potassium 4.9, normal LFTs, cholesterol 191, triglycerides 103, HDL 54, LDL 116, TSH 1.6.  Other Studies Reviewed  Today:  Carotid Dopplers 06/12/2013 showed 1-39% bilateral ICA stenoses.   Assessment and Plan:  1. Paroxysmal to persistent atrial fibrillation. Continue current management strategy including propranolol and Coumadin.  2. Mild carotid artery disease by Dopplers in May 2015. Will consider follow-up study for next year.  Current medicines were reviewed with the patient today.  Disposition: FU with me in 6 months.   Signed, Satira Sark, MD, [facility] 12/30/2014 3:26 PM    [facility] [place], [place], [place] [address] Phone: [phone]; Fax: [phone]

## 2014-12-30 NOTE — Patient Instructions (Signed)
Your physician recommends that you continue on your current medications as directed. Please refer to the Current Medication list given to you today. Your physician recommends that you schedule a follow-up appointment in: 6 months. You will receive a reminder letter in the mail in about 4 months reminding you to call and schedule your appointment. If you don't receive this letter, please contact our office. 

## 2015-01-06 ENCOUNTER — Ambulatory Visit (INDEPENDENT_AMBULATORY_CARE_PROVIDER_SITE_OTHER): Payer: Medicare Other | Admitting: Gastroenterology

## 2015-01-06 ENCOUNTER — Encounter: Payer: Self-pay | Admitting: Gastroenterology

## 2015-01-06 VITALS — BP 144/81 | HR 71 | Temp 98.0°F | Ht 72.0 in | Wt 198.6 lb

## 2015-01-06 DIAGNOSIS — K6289 Other specified diseases of anus and rectum: Secondary | ICD-10-CM | POA: Diagnosis not present

## 2015-01-06 DIAGNOSIS — K5901 Slow transit constipation: Secondary | ICD-10-CM | POA: Diagnosis not present

## 2015-01-06 MED ORDER — LINACLOTIDE 145 MCG PO CAPS: ORAL_CAPSULE | ORAL | Status: DC

## 2015-01-06 MED ORDER — LIDOCAINE-HYDROCORTISONE ACE 3-1 % RE KIT: PACK | RECTAL | Status: DC

## 2015-01-06 MED ORDER — NITROGLYCERIN 0.4 % RE OINT: TOPICAL_OINTMENT | RECTAL | Status: DC

## 2015-01-06 NOTE — Progress Notes (Signed)
Subjective:    Patient ID: Antonio Moreno, male    DOB: 02-05-1921, 81 y.o.   MRN: [id number]  Glenda Chroman., MD  HPI HARD STOOL VIRTUALLY EVERY DAY. EATS TWO KIWIS AND IT RELIVED IT BUT NOT NOW. TRIED AMITIZA AND DOESN'T REMEMBER IT HELPING. LINZESS CAUSED WATERY DIARRHEA. NOW SEEMS TO BE A TINY TIT THAT COMES OUT AND IT WASN'T HERE BEFORE. 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 OFTEN TIMES BURNING ALL DAY. NOTICES IT EVERY DAY FOR WEEKS AT A TIME. DRIVING AND SITTING PRESENTS A PROBLEM. SEE BLOOD IN STOOL: OCCASIONALLY. SEE ON THE PAPER. RARE DRIPS IN BOWL. WHEN STOOL PASSES HE HAS A RIPPING TEARING PAIN. DRNIKS WATER: NOT ENOUGH. EATS A LOT OF FIBER. HAS HAD PROBLEM WITH CONSTIPATION FOR 3-4 YRS. BMs: DAILY. NO PROBLEMS WITH SEDATION.   PT DENIES FEVER, CHILLS, HEMATEMESIS, nausea, vomiting, melena, diarrhea, CHEST PAIN, SHORTNESS OF BREATH,  CHANGE IN BOWEL IN HABITS, constipation, abdominal pain, problems swallowing, OR heartburn or indigestion.  Past Medical History  Diagnosis Date  . PAF (paroxysmal atrial fibrillation) (HCC)     On Coumadin  . Colon polyps   . Internal hemorrhoids   . Mild carotid artery disease (HCC)     1-39% stenosis bilaterally  . Atypical chest pain     Cardiolite 2009 without ischemia    Past Surgical History  Procedure Laterality Date  . Abcess of right thigh    . Lumbar laminectomy    . Cholecystectomy    . Colonoscopy N/A 11/18/2013    Procedure: COLONOSCOPY;  Surgeon: Rogene Houston, MD;  Location: [facility];  Service: Endoscopy;  Laterality: N/A;  730   Allergies  Allergen Reactions  . Penicillins     REACTION: rash   Current Outpatient Prescriptions  Medication Sig Dispense Refill  . clonazePAM (KLONOPIN) 0.25 MG disintegrating tablet Take 0.25 mg by mouth at bedtime as needed.    . doxepin (SINEQUAN) 25 MG capsule Take 50 mg by mouth daily.     Marland Kitchen dutasteride (AVODART) 0.5 MG capsule Take 0.5 mg by mouth daily.      Marland Kitchen lisinopril  (PRINIVIL,ZESTRIL) 10 MG tablet Take 10 mg by mouth daily.    . propranolol (INDERAL LA) 60 MG 24 hr capsule Take 60 mg by mouth daily.      . silodosin (RAPAFLO) 8 MG CAPS capsule Take 8 mg by mouth daily with breakfast.    . warfarin (COUMADIN) 5 MG tablet Take 5 mg by mouth as directed.       Family History  Problem Relation Age of Onset  . Colon cancer Neg Hx   . Colon polyps Neg Hx     Social History  Substance Use Topics  . Smoking status: Former Smoker -- 1.00 packs/day for 2 years    Types: Cigarettes    Start date: 10/26/1941    Quit date: 03/09/1944  . Smokeless tobacco: Never Used  . Alcohol Use: No    Review of Systems PER HPI OTHERWISE ALL SYSTEMS ARE NEGATIVE.    Objective:   Physical Exam  Constitutional: He is oriented to person, place, and time. He appears well-developed and well-nourished. No distress.  HENT:  Head: Normocephalic and atraumatic.  Mouth/Throat: Oropharynx is clear and moist. No oropharyngeal exudate.  Eyes: Pupils are equal, round, and reactive to light. No scleral icterus.  Neck: Normal range of motion. Neck supple.  Cardiovascular: Normal rate, regular rhythm and normal heart sounds.   Pulmonary/Chest: Effort normal and breath  sounds normal. No respiratory distress.  Abdominal: Soft. Bowel sounds are normal. He exhibits no distension. There is no tenderness.  Genitourinary: Prostate normal. Rectal exam shows external hemorrhoid, fissure and tenderness. Rectal exam shows no mass and anal tone normal.     Musculoskeletal: He exhibits no edema.  Lymphadenopathy:    He has no cervical adenopathy.  Neurological: He is alert and oriented to person, place, and time.  Psychiatric: He has a normal mood and affect.  Vitals reviewed.     Assessment & Plan:

## 2015-01-06 NOTE — Assessment & Plan Note (Signed)
SYMPTOMS NOT IDEALLY CONTROLLED. FAILED AMITIZA AND LINZESS IN THE PAST.   BREAK LINZESS CAPSULES AND PUT IN 4 TBSP WATER. TAKE 2 TBSP DAILY. IF NOT HAVING A BOWEL MOVEMENT 2 OR 3 TIMES A WEEK THAN TAKE 3 TBSP DAILY. DRINK WATER TO KEEP YOUR URINE LIGHT YELLOW. CONTINUE A HIGH FIBER DIET. USE [facility] HEMORRHOID CREAM FOUR TIMES DAILY FOR ONE MONTH. PLEASE CALL IN ONE MONTH IF SYMPTOMS ARE NOT IMPROVED.  FOLLOW UP IN April 2017.

## 2015-01-06 NOTE — Progress Notes (Signed)
ON RECALL  °

## 2015-01-06 NOTE — Progress Notes (Signed)
cc'ed to pcp °

## 2015-01-06 NOTE — Patient Instructions (Signed)
BREAK LINZESS CAPSULES AND PUT IN 4 TBSP WATER. TAKE 2 TBSP DAILY. IF NOT HAVING A BOWEL MOVEMENT 2 OR 3 TIMES A WEEK THAN TAKE 3 TBSP DAILY.  DRINK WATER TO KEEP YOUR URINE LIGHT YELLOW.  CONTINUE A HIGH FIBER DIET.  ADD Nitroglycerin ointment 0.125 %. USE a pea sized amount internally four times daily FOR TWO MONTHS. IT MAY CAUSE HEADACHES OR DROP IN BLOOD PRESSURE. TAKE FIRST DOSE AND REMAIN SEATED OR LAYING DOWN FOR 10-15 MINS.  USE [place] [facility] HEMORRHOID CREAM FOUR TIMES DAILY FOR ONE MONTH.  PLEASE CALL IN ONE MONTH IF SYMPTOMS ARE NOT IMPROVED.   FOLLOW UP IN April 2017.

## 2015-01-06 NOTE — Assessment & Plan Note (Signed)
DUE TO ANAL FISSURE. RARE RECTAL BLEEDING. SYMPTOMS EXACERBATED BY HARD STOOLS. DIDN'T TOLERATE LINZESS IN PAST. FAILED AMITIZA.  BREAK LINZESS CAPSULES AND PUT IN 4 TBSP WATER. TAKE 2 TBSP DAILY. IF NOT HAVING A BOWEL MOVEMENT 2 OR 3 TIMES A WEEK THAN TAKE 3 TBSP DAILY. DRINK WATER TO KEEP YOUR URINE LIGHT YELLOW. CONTINUE A HIGH FIBER DIET. ADD Nitroglycerin ointment 0.125 %. USE a pea sized amount internally four times daily FOR TWO MONTHS. IT MAY CAUSE HEADACHES OR DROP IN BLOOD PRESSURE. TAKE FIRST DOSE AND REMAIN SEATED OR LAYING DOWN FOR 10-15 MINS. USE [place] [facility] HEMORRHOID CREAM FOUR TIMES DAILY FOR ONE MONTH. PLEASE CALL IN ONE MONTH IF SYMPTOMS ARE NOT IMPROVED.  FOLLOW UP IN April 2017.

## 2015-01-19 ENCOUNTER — Ambulatory Visit (INDEPENDENT_AMBULATORY_CARE_PROVIDER_SITE_OTHER): Payer: Medicare Other | Admitting: Urology

## 2015-01-19 DIAGNOSIS — N5201 Erectile dysfunction due to arterial insufficiency: Secondary | ICD-10-CM | POA: Diagnosis not present

## 2015-01-19 DIAGNOSIS — N401 Enlarged prostate with lower urinary tract symptoms: Secondary | ICD-10-CM

## 2015-03-08 DIAGNOSIS — I4891 Unspecified atrial fibrillation: Secondary | ICD-10-CM | POA: Diagnosis not present

## 2015-04-12 DIAGNOSIS — I4891 Unspecified atrial fibrillation: Secondary | ICD-10-CM | POA: Diagnosis not present

## 2015-04-28 ENCOUNTER — Encounter: Payer: Self-pay | Admitting: Gastroenterology

## 2015-05-12 DIAGNOSIS — I4891 Unspecified atrial fibrillation: Secondary | ICD-10-CM | POA: Diagnosis not present

## 2015-05-12 DIAGNOSIS — E1165 Type 2 diabetes mellitus with hyperglycemia: Secondary | ICD-10-CM | POA: Diagnosis not present

## 2015-06-14 DIAGNOSIS — R58 Hemorrhage, not elsewhere classified: Secondary | ICD-10-CM | POA: Diagnosis not present

## 2015-06-14 DIAGNOSIS — I4891 Unspecified atrial fibrillation: Secondary | ICD-10-CM | POA: Diagnosis not present

## 2015-06-15 DIAGNOSIS — I2510 Atherosclerotic heart disease of native coronary artery without angina pectoris: Secondary | ICD-10-CM | POA: Diagnosis not present

## 2015-06-15 DIAGNOSIS — I4891 Unspecified atrial fibrillation: Secondary | ICD-10-CM | POA: Diagnosis not present

## 2015-06-17 DIAGNOSIS — E1159 Type 2 diabetes mellitus with other circulatory complications: Secondary | ICD-10-CM | POA: Diagnosis not present

## 2015-06-17 DIAGNOSIS — R58 Hemorrhage, not elsewhere classified: Secondary | ICD-10-CM | POA: Diagnosis not present

## 2015-07-12 DIAGNOSIS — I4891 Unspecified atrial fibrillation: Secondary | ICD-10-CM | POA: Diagnosis not present

## 2015-07-28 DIAGNOSIS — I4891 Unspecified atrial fibrillation: Secondary | ICD-10-CM | POA: Diagnosis not present

## 2015-07-28 DIAGNOSIS — M1710 Unilateral primary osteoarthritis, unspecified knee: Secondary | ICD-10-CM | POA: Diagnosis not present

## 2015-07-28 DIAGNOSIS — M169 Osteoarthritis of hip, unspecified: Secondary | ICD-10-CM | POA: Diagnosis not present

## 2015-07-28 DIAGNOSIS — M25551 Pain in right hip: Secondary | ICD-10-CM | POA: Diagnosis not present

## 2015-08-16 DIAGNOSIS — G4700 Insomnia, unspecified: Secondary | ICD-10-CM | POA: Diagnosis not present

## 2015-08-16 DIAGNOSIS — I4891 Unspecified atrial fibrillation: Secondary | ICD-10-CM | POA: Diagnosis not present

## 2015-08-16 DIAGNOSIS — Z299 Encounter for prophylactic measures, unspecified: Secondary | ICD-10-CM | POA: Diagnosis not present

## 2015-08-18 DIAGNOSIS — J069 Acute upper respiratory infection, unspecified: Secondary | ICD-10-CM | POA: Diagnosis not present

## 2015-08-20 ENCOUNTER — Ambulatory Visit (INDEPENDENT_AMBULATORY_CARE_PROVIDER_SITE_OTHER): Payer: Medicare Other | Admitting: Cardiology

## 2015-08-20 ENCOUNTER — Encounter: Payer: Self-pay | Admitting: Cardiology

## 2015-08-20 VITALS — BP 154/72 | HR 59 | Ht 72.0 in | Wt 192.6 lb

## 2015-08-20 DIAGNOSIS — I480 Paroxysmal atrial fibrillation: Secondary | ICD-10-CM

## 2015-08-20 DIAGNOSIS — I779 Disorder of arteries and arterioles, unspecified: Secondary | ICD-10-CM

## 2015-08-20 DIAGNOSIS — I739 Peripheral vascular disease, unspecified: Secondary | ICD-10-CM

## 2015-08-20 NOTE — Patient Instructions (Signed)
Your physician wants you to follow-up in: 6 months with Dr. McDowell You will receive a reminder letter in the mail two months in advance. If you don't receive a letter, please call our office to schedule the follow-up appointment.  Your physician recommends that you continue on your current medications as directed. Please refer to the Current Medication list given to you today.  Thank you for choosing [facility]!!    

## 2015-08-20 NOTE — Progress Notes (Signed)
Cardiology Office Note  Date: 08/20/2015   ID: Antonio Moreno, DOB [birth year], MRN [id number]  PCP: Glenda Chroman, MD  Primary Cardiologist: Rozann Lesches, MD   Chief Complaint  Patient presents with  . Atrial Fibrillation    History of Present Illness: Antonio Moreno is a 82 y.o. male last seen in November 2016. He presents for a routine follow-up visit. Reports no significant change in frequency of occasional palpitations. 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  He continues on Coumadin and follows with Dr. Woody Seller. No reported spontaneous bleeding episodes.  I reviewed his ECG today which shows sinus bradycardia at 58 bpm. We went over his medications which are outlined below. Cardiac regimen is stable including propranolol, lisinopril, and Coumadin.  Past Medical History  Diagnosis Date  . PAF (paroxysmal atrial fibrillation) (HCC)     On Coumadin  . Colon polyps   . Internal hemorrhoids   . Mild carotid artery disease (HCC)     1-39% stenosis bilaterally  . Atypical chest pain     Cardiolite 2009 without ischemia     Current Outpatient Prescriptions  Medication Sig Dispense Refill  . clonazePAM (KLONOPIN) 0.25 MG disintegrating tablet Take 0.25 mg by mouth at bedtime as needed.    . doxepin (SINEQUAN) 25 MG capsule Take 12.5 mg by mouth at bedtime.     . dutasteride (AVODART) 0.5 MG capsule Take 0.5 mg by mouth daily.      . Linaclotide (LINZESS) 145 MCG CAPS capsule 1 PO 30 mins prior to your first meal 30 capsule 11  . lisinopril (PRINIVIL,ZESTRIL) 10 MG tablet Take 10 mg by mouth daily.    . propranolol (INDERAL LA) 60 MG 24 hr capsule Take 60 mg by mouth daily.      . silodosin (RAPAFLO) 8 MG CAPS capsule Take 8 mg by mouth daily with breakfast.    . warfarin (COUMADIN) 5 MG tablet Take 5 mg by mouth as directed. MANAGED BY PMD     No current facility-administered medications for this visit.    Allergies:  Penicillins   Social History: The patient  reports that he quit smoking about 71 years ago. His smoking use included Cigarettes. He started smoking about 73 years ago. He has a 2 pack-year smoking history. He has never used smokeless tobacco. He reports that he does not drink alcohol or use illicit drugs.   ROS:  Please see the history of present illness. Otherwise, complete review of systems is positive for intermittent napping.  All other systems are reviewed and negative.   Physical Exam: VS:  BP 154/72 mmHg  Pulse 59  Ht 6' (1.829 m)  Wt 192 lb 9.6 oz (87.363 kg)  BMI 26.12 kg/m2  SpO2 98%, BMI Body mass index is 26.12 kg/(m^2).  Wt Readings from Last 3 Encounters:  08/20/15 192 lb 9.6 oz (87.363 kg)  01/06/15 198 lb 9.6 oz (90.084 kg)  12/30/14 193 lb 12.8 oz (87.907 kg)    Tall elderly male, appears comfortable at rest.  HEENT: Conjunctiva and lids normal, oropharynx clear.  Neck: Supple, no elevated JVP, no thyromegaly.  Lungs: Clear to auscultation, nonlabored breathing at rest.  Cardiac: Regular rate and rhythm, soft systolic murmur, no pericardial rub.  Abdomen: Soft, nontender, bowel sounds present.  Extremities: No pitting edema, distal pulses 2+.  ECG: I personally reviewed the tracing from 01/27/2014 which showed sinus bradycardia.  Assessment and  Plan:  1. Paroxysmal atrial fibrillation, no significant change in symptomatology. ECG reviewed today and shows sinus bradycardia. He will continue on current regimen including propranolol and Coumadin (followed by Dr Woody Seller). Continue observation.  2. History of mild carotid artery atherosclerosis, no significant change on examination without bruits. No new neurological symptoms.  Current medicines were reviewed with the patient today.   Orders Placed This Encounter  Procedures  . EKG 12-Lead    Disposition: Follow-up with me in 6 months.  Signed, Satira Sark, MD, [facility] 08/20/2015 11:26  AM    [facility] [place], [place], [place] [address] Phone: [phone]; Fax: [phone]

## 2015-09-13 DIAGNOSIS — I4891 Unspecified atrial fibrillation: Secondary | ICD-10-CM | POA: Diagnosis not present

## 2015-09-29 DIAGNOSIS — Z79899 Other long term (current) drug therapy: Secondary | ICD-10-CM | POA: Diagnosis not present

## 2015-09-29 DIAGNOSIS — Z299 Encounter for prophylactic measures, unspecified: Secondary | ICD-10-CM | POA: Diagnosis not present

## 2015-09-29 DIAGNOSIS — Z1211 Encounter for screening for malignant neoplasm of colon: Secondary | ICD-10-CM | POA: Diagnosis not present

## 2015-09-29 DIAGNOSIS — R5383 Other fatigue: Secondary | ICD-10-CM | POA: Diagnosis not present

## 2015-09-29 DIAGNOSIS — Z7189 Other specified counseling: Secondary | ICD-10-CM | POA: Diagnosis not present

## 2015-09-29 DIAGNOSIS — Z125 Encounter for screening for malignant neoplasm of prostate: Secondary | ICD-10-CM | POA: Diagnosis not present

## 2015-09-29 DIAGNOSIS — Z0000 Encounter for general adult medical examination without abnormal findings: Secondary | ICD-10-CM | POA: Diagnosis not present

## 2015-09-29 DIAGNOSIS — Z1389 Encounter for screening for other disorder: Secondary | ICD-10-CM | POA: Diagnosis not present

## 2015-09-29 DIAGNOSIS — E1165 Type 2 diabetes mellitus with hyperglycemia: Secondary | ICD-10-CM | POA: Diagnosis not present

## 2015-10-14 DIAGNOSIS — I4891 Unspecified atrial fibrillation: Secondary | ICD-10-CM | POA: Diagnosis not present

## 2015-10-19 DIAGNOSIS — Z299 Encounter for prophylactic measures, unspecified: Secondary | ICD-10-CM | POA: Diagnosis not present

## 2015-10-19 DIAGNOSIS — Z6827 Body mass index (BMI) 27.0-27.9, adult: Secondary | ICD-10-CM | POA: Diagnosis not present

## 2015-10-19 DIAGNOSIS — L02214 Cutaneous abscess of groin: Secondary | ICD-10-CM | POA: Diagnosis not present

## 2015-10-19 DIAGNOSIS — Z713 Dietary counseling and surveillance: Secondary | ICD-10-CM | POA: Diagnosis not present

## 2015-10-19 DIAGNOSIS — I10 Essential (primary) hypertension: Secondary | ICD-10-CM | POA: Diagnosis not present

## 2015-11-16 DIAGNOSIS — Z6827 Body mass index (BMI) 27.0-27.9, adult: Secondary | ICD-10-CM | POA: Diagnosis not present

## 2015-11-16 DIAGNOSIS — Z713 Dietary counseling and surveillance: Secondary | ICD-10-CM | POA: Diagnosis not present

## 2015-11-16 DIAGNOSIS — I4891 Unspecified atrial fibrillation: Secondary | ICD-10-CM | POA: Diagnosis not present

## 2015-12-06 DIAGNOSIS — D239 Other benign neoplasm of skin, unspecified: Secondary | ICD-10-CM | POA: Diagnosis not present

## 2015-12-06 DIAGNOSIS — L570 Actinic keratosis: Secondary | ICD-10-CM | POA: Diagnosis not present

## 2015-12-06 DIAGNOSIS — D692 Other nonthrombocytopenic purpura: Secondary | ICD-10-CM | POA: Diagnosis not present

## 2015-12-09 DIAGNOSIS — I10 Essential (primary) hypertension: Secondary | ICD-10-CM | POA: Diagnosis not present

## 2015-12-09 DIAGNOSIS — R05 Cough: Secondary | ICD-10-CM | POA: Diagnosis not present

## 2015-12-09 DIAGNOSIS — J209 Acute bronchitis, unspecified: Secondary | ICD-10-CM | POA: Diagnosis not present

## 2015-12-13 DIAGNOSIS — Z6827 Body mass index (BMI) 27.0-27.9, adult: Secondary | ICD-10-CM | POA: Diagnosis not present

## 2015-12-13 DIAGNOSIS — Z299 Encounter for prophylactic measures, unspecified: Secondary | ICD-10-CM | POA: Diagnosis not present

## 2015-12-13 DIAGNOSIS — R5383 Other fatigue: Secondary | ICD-10-CM | POA: Diagnosis not present

## 2015-12-13 DIAGNOSIS — J069 Acute upper respiratory infection, unspecified: Secondary | ICD-10-CM | POA: Diagnosis not present

## 2015-12-13 DIAGNOSIS — G4700 Insomnia, unspecified: Secondary | ICD-10-CM | POA: Diagnosis not present

## 2016-01-10 DIAGNOSIS — I4891 Unspecified atrial fibrillation: Secondary | ICD-10-CM | POA: Diagnosis not present

## 2016-01-10 DIAGNOSIS — E119 Type 2 diabetes mellitus without complications: Secondary | ICD-10-CM | POA: Diagnosis not present

## 2016-01-18 ENCOUNTER — Ambulatory Visit (INDEPENDENT_AMBULATORY_CARE_PROVIDER_SITE_OTHER): Payer: Medicare Other | Admitting: Urology

## 2016-01-18 DIAGNOSIS — N401 Enlarged prostate with lower urinary tract symptoms: Secondary | ICD-10-CM

## 2016-01-20 DIAGNOSIS — Z23 Encounter for immunization: Secondary | ICD-10-CM | POA: Diagnosis not present

## 2016-01-25 DIAGNOSIS — Z713 Dietary counseling and surveillance: Secondary | ICD-10-CM | POA: Diagnosis not present

## 2016-01-25 DIAGNOSIS — J209 Acute bronchitis, unspecified: Secondary | ICD-10-CM | POA: Diagnosis not present

## 2016-01-25 DIAGNOSIS — Z6827 Body mass index (BMI) 27.0-27.9, adult: Secondary | ICD-10-CM | POA: Diagnosis not present

## 2016-01-25 DIAGNOSIS — Z299 Encounter for prophylactic measures, unspecified: Secondary | ICD-10-CM | POA: Diagnosis not present

## 2016-01-25 DIAGNOSIS — Z789 Other specified health status: Secondary | ICD-10-CM | POA: Diagnosis not present

## 2016-02-02 DIAGNOSIS — R1030 Lower abdominal pain, unspecified: Secondary | ICD-10-CM | POA: Diagnosis not present

## 2016-02-02 DIAGNOSIS — R791 Abnormal coagulation profile: Secondary | ICD-10-CM | POA: Diagnosis not present

## 2016-02-02 DIAGNOSIS — Z79899 Other long term (current) drug therapy: Secondary | ICD-10-CM | POA: Diagnosis not present

## 2016-02-02 DIAGNOSIS — Z87891 Personal history of nicotine dependence: Secondary | ICD-10-CM | POA: Diagnosis not present

## 2016-02-02 DIAGNOSIS — K529 Noninfective gastroenteritis and colitis, unspecified: Secondary | ICD-10-CM | POA: Diagnosis not present

## 2016-02-02 DIAGNOSIS — D689 Coagulation defect, unspecified: Secondary | ICD-10-CM | POA: Diagnosis not present

## 2016-02-02 DIAGNOSIS — R05 Cough: Secondary | ICD-10-CM | POA: Diagnosis not present

## 2016-02-02 DIAGNOSIS — Z7901 Long term (current) use of anticoagulants: Secondary | ICD-10-CM | POA: Diagnosis not present

## 2016-02-28 DIAGNOSIS — I4891 Unspecified atrial fibrillation: Secondary | ICD-10-CM | POA: Diagnosis not present

## 2016-03-06 DIAGNOSIS — J209 Acute bronchitis, unspecified: Secondary | ICD-10-CM | POA: Diagnosis not present

## 2016-03-06 DIAGNOSIS — I10 Essential (primary) hypertension: Secondary | ICD-10-CM | POA: Diagnosis not present

## 2016-03-06 DIAGNOSIS — R05 Cough: Secondary | ICD-10-CM | POA: Diagnosis not present

## 2016-03-24 DIAGNOSIS — I2510 Atherosclerotic heart disease of native coronary artery without angina pectoris: Secondary | ICD-10-CM | POA: Diagnosis not present

## 2016-03-24 DIAGNOSIS — I4891 Unspecified atrial fibrillation: Secondary | ICD-10-CM | POA: Diagnosis not present

## 2016-04-05 DIAGNOSIS — I4891 Unspecified atrial fibrillation: Secondary | ICD-10-CM | POA: Diagnosis not present

## 2016-04-17 DIAGNOSIS — J9801 Acute bronchospasm: Secondary | ICD-10-CM | POA: Diagnosis not present

## 2016-04-17 DIAGNOSIS — I10 Essential (primary) hypertension: Secondary | ICD-10-CM | POA: Diagnosis not present

## 2016-04-17 DIAGNOSIS — Z7901 Long term (current) use of anticoagulants: Secondary | ICD-10-CM | POA: Diagnosis not present

## 2016-04-17 DIAGNOSIS — R05 Cough: Secondary | ICD-10-CM | POA: Diagnosis not present

## 2016-05-02 DIAGNOSIS — I4891 Unspecified atrial fibrillation: Secondary | ICD-10-CM | POA: Diagnosis not present

## 2016-05-02 DIAGNOSIS — Z299 Encounter for prophylactic measures, unspecified: Secondary | ICD-10-CM | POA: Diagnosis not present

## 2016-05-02 DIAGNOSIS — E1165 Type 2 diabetes mellitus with hyperglycemia: Secondary | ICD-10-CM | POA: Diagnosis not present

## 2016-05-02 DIAGNOSIS — Z713 Dietary counseling and surveillance: Secondary | ICD-10-CM | POA: Diagnosis not present

## 2016-05-02 DIAGNOSIS — Z6827 Body mass index (BMI) 27.0-27.9, adult: Secondary | ICD-10-CM | POA: Diagnosis not present

## 2016-05-15 DIAGNOSIS — J45909 Unspecified asthma, uncomplicated: Secondary | ICD-10-CM | POA: Diagnosis not present

## 2016-05-15 DIAGNOSIS — Z6827 Body mass index (BMI) 27.0-27.9, adult: Secondary | ICD-10-CM | POA: Diagnosis not present

## 2016-05-15 DIAGNOSIS — I4891 Unspecified atrial fibrillation: Secondary | ICD-10-CM | POA: Diagnosis not present

## 2016-05-15 DIAGNOSIS — Z299 Encounter for prophylactic measures, unspecified: Secondary | ICD-10-CM | POA: Diagnosis not present

## 2016-05-15 DIAGNOSIS — E1165 Type 2 diabetes mellitus with hyperglycemia: Secondary | ICD-10-CM | POA: Diagnosis not present

## 2016-06-12 DIAGNOSIS — J209 Acute bronchitis, unspecified: Secondary | ICD-10-CM | POA: Diagnosis not present

## 2016-06-12 DIAGNOSIS — I4891 Unspecified atrial fibrillation: Secondary | ICD-10-CM | POA: Diagnosis not present

## 2016-06-12 DIAGNOSIS — J45909 Unspecified asthma, uncomplicated: Secondary | ICD-10-CM | POA: Diagnosis not present

## 2016-06-20 DIAGNOSIS — M169 Osteoarthritis of hip, unspecified: Secondary | ICD-10-CM | POA: Diagnosis not present

## 2016-06-20 DIAGNOSIS — M1710 Unilateral primary osteoarthritis, unspecified knee: Secondary | ICD-10-CM | POA: Diagnosis not present

## 2016-06-20 DIAGNOSIS — I10 Essential (primary) hypertension: Secondary | ICD-10-CM | POA: Diagnosis not present

## 2016-06-20 DIAGNOSIS — G4700 Insomnia, unspecified: Secondary | ICD-10-CM | POA: Diagnosis not present

## 2016-06-20 DIAGNOSIS — E1165 Type 2 diabetes mellitus with hyperglycemia: Secondary | ICD-10-CM | POA: Diagnosis not present

## 2016-06-20 DIAGNOSIS — F419 Anxiety disorder, unspecified: Secondary | ICD-10-CM | POA: Diagnosis not present

## 2016-06-20 DIAGNOSIS — I2510 Atherosclerotic heart disease of native coronary artery without angina pectoris: Secondary | ICD-10-CM | POA: Diagnosis not present

## 2016-06-20 DIAGNOSIS — Z299 Encounter for prophylactic measures, unspecified: Secondary | ICD-10-CM | POA: Diagnosis not present

## 2016-06-20 DIAGNOSIS — I4891 Unspecified atrial fibrillation: Secondary | ICD-10-CM | POA: Diagnosis not present

## 2016-06-22 DIAGNOSIS — M24852 Other specific joint derangements of left hip, not elsewhere classified: Secondary | ICD-10-CM | POA: Diagnosis not present

## 2016-06-22 DIAGNOSIS — M1611 Unilateral primary osteoarthritis, right hip: Secondary | ICD-10-CM | POA: Diagnosis not present

## 2016-06-22 DIAGNOSIS — M1612 Unilateral primary osteoarthritis, left hip: Secondary | ICD-10-CM | POA: Diagnosis not present

## 2016-06-22 DIAGNOSIS — M24851 Other specific joint derangements of right hip, not elsewhere classified: Secondary | ICD-10-CM | POA: Diagnosis not present

## 2016-06-22 DIAGNOSIS — M160 Bilateral primary osteoarthritis of hip: Secondary | ICD-10-CM | POA: Diagnosis not present

## 2016-07-13 DIAGNOSIS — I4891 Unspecified atrial fibrillation: Secondary | ICD-10-CM | POA: Diagnosis not present

## 2016-07-13 DIAGNOSIS — B359 Dermatophytosis, unspecified: Secondary | ICD-10-CM | POA: Diagnosis not present

## 2016-07-13 DIAGNOSIS — J45909 Unspecified asthma, uncomplicated: Secondary | ICD-10-CM | POA: Diagnosis not present

## 2016-08-10 DIAGNOSIS — J45909 Unspecified asthma, uncomplicated: Secondary | ICD-10-CM | POA: Diagnosis not present

## 2016-08-10 DIAGNOSIS — Z713 Dietary counseling and surveillance: Secondary | ICD-10-CM | POA: Diagnosis not present

## 2016-08-10 DIAGNOSIS — Z299 Encounter for prophylactic measures, unspecified: Secondary | ICD-10-CM | POA: Diagnosis not present

## 2016-08-10 DIAGNOSIS — Z6827 Body mass index (BMI) 27.0-27.9, adult: Secondary | ICD-10-CM | POA: Diagnosis not present

## 2016-08-10 DIAGNOSIS — I4891 Unspecified atrial fibrillation: Secondary | ICD-10-CM | POA: Diagnosis not present

## 2016-08-10 DIAGNOSIS — I2510 Atherosclerotic heart disease of native coronary artery without angina pectoris: Secondary | ICD-10-CM | POA: Diagnosis not present

## 2016-08-10 DIAGNOSIS — E1165 Type 2 diabetes mellitus with hyperglycemia: Secondary | ICD-10-CM | POA: Diagnosis not present

## 2016-08-10 DIAGNOSIS — G4700 Insomnia, unspecified: Secondary | ICD-10-CM | POA: Diagnosis not present

## 2016-08-10 DIAGNOSIS — I10 Essential (primary) hypertension: Secondary | ICD-10-CM | POA: Diagnosis not present

## 2016-09-05 ENCOUNTER — Ambulatory Visit (INDEPENDENT_AMBULATORY_CARE_PROVIDER_SITE_OTHER): Payer: Medicare Other | Admitting: Urology

## 2016-09-05 DIAGNOSIS — N401 Enlarged prostate with lower urinary tract symptoms: Secondary | ICD-10-CM

## 2016-09-06 ENCOUNTER — Other Ambulatory Visit: Payer: Self-pay | Admitting: Dermatology

## 2016-09-06 DIAGNOSIS — D492 Neoplasm of unspecified behavior of bone, soft tissue, and skin: Secondary | ICD-10-CM | POA: Diagnosis not present

## 2016-09-06 DIAGNOSIS — L820 Inflamed seborrheic keratosis: Secondary | ICD-10-CM | POA: Diagnosis not present

## 2016-09-06 DIAGNOSIS — L570 Actinic keratosis: Secondary | ICD-10-CM | POA: Diagnosis not present

## 2016-09-08 DIAGNOSIS — I2510 Atherosclerotic heart disease of native coronary artery without angina pectoris: Secondary | ICD-10-CM | POA: Diagnosis not present

## 2016-09-08 DIAGNOSIS — F419 Anxiety disorder, unspecified: Secondary | ICD-10-CM | POA: Diagnosis not present

## 2016-09-08 DIAGNOSIS — I4891 Unspecified atrial fibrillation: Secondary | ICD-10-CM | POA: Diagnosis not present

## 2016-09-08 DIAGNOSIS — M169 Osteoarthritis of hip, unspecified: Secondary | ICD-10-CM | POA: Diagnosis not present

## 2016-09-08 DIAGNOSIS — M1710 Unilateral primary osteoarthritis, unspecified knee: Secondary | ICD-10-CM | POA: Diagnosis not present

## 2016-09-08 DIAGNOSIS — Z299 Encounter for prophylactic measures, unspecified: Secondary | ICD-10-CM | POA: Diagnosis not present

## 2016-09-08 DIAGNOSIS — E1165 Type 2 diabetes mellitus with hyperglycemia: Secondary | ICD-10-CM | POA: Diagnosis not present

## 2016-09-08 DIAGNOSIS — I10 Essential (primary) hypertension: Secondary | ICD-10-CM | POA: Diagnosis not present

## 2016-09-08 DIAGNOSIS — Z6826 Body mass index (BMI) 26.0-26.9, adult: Secondary | ICD-10-CM | POA: Diagnosis not present

## 2016-10-06 DIAGNOSIS — R5383 Other fatigue: Secondary | ICD-10-CM | POA: Diagnosis not present

## 2016-10-06 DIAGNOSIS — Z0000 Encounter for general adult medical examination without abnormal findings: Secondary | ICD-10-CM | POA: Diagnosis not present

## 2016-10-06 DIAGNOSIS — Z7189 Other specified counseling: Secondary | ICD-10-CM | POA: Diagnosis not present

## 2016-10-06 DIAGNOSIS — I4891 Unspecified atrial fibrillation: Secondary | ICD-10-CM | POA: Diagnosis not present

## 2016-10-06 DIAGNOSIS — Z1389 Encounter for screening for other disorder: Secondary | ICD-10-CM | POA: Diagnosis not present

## 2016-10-06 DIAGNOSIS — Z125 Encounter for screening for malignant neoplasm of prostate: Secondary | ICD-10-CM | POA: Diagnosis not present

## 2016-10-06 DIAGNOSIS — I10 Essential (primary) hypertension: Secondary | ICD-10-CM | POA: Diagnosis not present

## 2016-10-06 DIAGNOSIS — F419 Anxiety disorder, unspecified: Secondary | ICD-10-CM | POA: Diagnosis not present

## 2016-10-06 DIAGNOSIS — Z6826 Body mass index (BMI) 26.0-26.9, adult: Secondary | ICD-10-CM | POA: Diagnosis not present

## 2016-10-06 DIAGNOSIS — Z79899 Other long term (current) drug therapy: Secondary | ICD-10-CM | POA: Diagnosis not present

## 2016-10-06 DIAGNOSIS — Z299 Encounter for prophylactic measures, unspecified: Secondary | ICD-10-CM | POA: Diagnosis not present

## 2016-11-09 DIAGNOSIS — G4700 Insomnia, unspecified: Secondary | ICD-10-CM | POA: Diagnosis not present

## 2016-11-09 DIAGNOSIS — I4891 Unspecified atrial fibrillation: Secondary | ICD-10-CM | POA: Diagnosis not present

## 2016-11-09 DIAGNOSIS — M25512 Pain in left shoulder: Secondary | ICD-10-CM | POA: Diagnosis not present

## 2016-11-14 DIAGNOSIS — M19011 Primary osteoarthritis, right shoulder: Secondary | ICD-10-CM | POA: Diagnosis not present

## 2016-11-14 DIAGNOSIS — M7522 Bicipital tendinitis, left shoulder: Secondary | ICD-10-CM | POA: Diagnosis not present

## 2016-11-14 DIAGNOSIS — M7582 Other shoulder lesions, left shoulder: Secondary | ICD-10-CM | POA: Diagnosis not present

## 2016-11-14 DIAGNOSIS — M19012 Primary osteoarthritis, left shoulder: Secondary | ICD-10-CM | POA: Diagnosis not present

## 2016-11-15 DIAGNOSIS — Z23 Encounter for immunization: Secondary | ICD-10-CM | POA: Diagnosis not present

## 2016-11-16 DIAGNOSIS — I4891 Unspecified atrial fibrillation: Secondary | ICD-10-CM | POA: Diagnosis not present

## 2016-11-16 DIAGNOSIS — I2510 Atherosclerotic heart disease of native coronary artery without angina pectoris: Secondary | ICD-10-CM | POA: Diagnosis not present

## 2016-12-20 DIAGNOSIS — M19011 Primary osteoarthritis, right shoulder: Secondary | ICD-10-CM | POA: Diagnosis not present

## 2016-12-20 DIAGNOSIS — M160 Bilateral primary osteoarthritis of hip: Secondary | ICD-10-CM | POA: Diagnosis not present

## 2016-12-20 DIAGNOSIS — M19012 Primary osteoarthritis, left shoulder: Secondary | ICD-10-CM | POA: Diagnosis not present

## 2016-12-22 DIAGNOSIS — I4891 Unspecified atrial fibrillation: Secondary | ICD-10-CM | POA: Diagnosis not present

## 2016-12-22 DIAGNOSIS — Z299 Encounter for prophylactic measures, unspecified: Secondary | ICD-10-CM | POA: Diagnosis not present

## 2016-12-22 DIAGNOSIS — Z6826 Body mass index (BMI) 26.0-26.9, adult: Secondary | ICD-10-CM | POA: Diagnosis not present

## 2016-12-22 DIAGNOSIS — Z713 Dietary counseling and surveillance: Secondary | ICD-10-CM | POA: Diagnosis not present

## 2016-12-22 DIAGNOSIS — E1165 Type 2 diabetes mellitus with hyperglycemia: Secondary | ICD-10-CM | POA: Diagnosis not present

## 2016-12-26 DIAGNOSIS — I4891 Unspecified atrial fibrillation: Secondary | ICD-10-CM | POA: Diagnosis not present

## 2016-12-26 DIAGNOSIS — I2510 Atherosclerotic heart disease of native coronary artery without angina pectoris: Secondary | ICD-10-CM | POA: Diagnosis not present

## 2017-01-22 NOTE — Progress Notes (Signed)
Cardiology Office Note  Date: 01/23/2017   ID: [name], DOB 1920-10-30, MRN [id number]  PCP: Glenda Chroman, MD  Primary Cardiologist: Rozann Lesches, MD   Chief Complaint  Patient presents with  . Atrial Fibrillation    History of Present Illness: Antonio Moreno is a 83 y.o. male last seen in July 2017. He presents overdue for a routine follow-up visit. From a cardiac perspective he reports occasional palpitations, no accelerating chest pain or shortness of breath. 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  He remains on Coumadin with follow-up per Dr. Woody Seller. He does not report any spontaneous bleeding problems.  I personally reviewed his ECG today which shows sinus rhythm with borderline prolonged PR interval, low voltage, decreased R wave depression. We went over his remaining medications. Blood pressure is well controlled today on lisinopril.  Past Medical History:  Diagnosis Date  . Atypical chest pain    Cardiolite 2009 without ischemia   . Colon polyps   . Internal hemorrhoids   . Mild carotid artery disease (HCC)    1-39% stenosis bilaterally  . PAF (paroxysmal atrial fibrillation) (HCC)    On Coumadin    Past Surgical History:  Procedure Laterality Date  . Abcess of Right Thigh    . CHOLECYSTECTOMY    . COLONOSCOPY N/A 11/18/2013   Procedure: COLONOSCOPY;  Surgeon: Rogene Houston, MD;  [facility];  Service: Endoscopy;  Laterality: N/A;  730  . LUMBAR LAMINECTOMY      Current Outpatient Medications  Medication Sig Dispense Refill  . doxepin (SINEQUAN) 25 MG capsule Take 12.5 mg by mouth at bedtime.     . dutasteride (AVODART) 0.5 MG capsule Take 0.5 mg by mouth daily.      Marland Kitchen lisinopril (PRINIVIL,ZESTRIL) 10 MG tablet Take 10 mg by mouth daily.    . propranolol (INDERAL LA) 60 MG 24 hr capsule Take 60 mg by mouth daily.      . silodosin (RAPAFLO) 8 MG CAPS capsule Take 8 mg by  mouth once a week.     . warfarin (COUMADIN) 5 MG tablet Take 5 mg by mouth as directed. MANAGED BY PMD     No current facility-administered medications for this visit.    Allergies:  Penicillins   Social History: The patient  reports that he quit smoking about 72 years ago. His smoking use included cigarettes. He started smoking about 75 years ago. He has a 2.00 pack-year smoking history. he has never used smokeless tobacco. He reports that he does not drink alcohol or use drugs.  ROS:  Please see the history of present illness. Otherwise, complete review of systems is positive for hip arthritic pains.  All other systems are reviewed and negative.   Physical Exam: VS:  BP 122/82   Pulse 80   Ht 5\' 11"  (1.803 m)   Wt 196 lb (88.9 kg)   SpO2 96%   BMI 27.34 kg/m , BMI Body mass index is 27.34 kg/m.  Wt Readings from Last 3 Encounters:  01/23/17 196 lb (88.9 kg)  08/20/15 192 lb 9.6 oz (87.4 kg)  01/06/15 198 lb 9.6 oz (90.1 kg)    General: Elderly male, appears comfortable at rest. HEENT: Conjunctiva and lids normal, oropharynx clear. Neck: Supple, no elevated JVP or carotid bruits, no thyromegaly. Lungs: Clear to auscultation, nonlabored breathing at rest. Cardiac: Regular rate and rhythm, no S3, soft systolic murmur,  no pericardial rub. Abdomen: Soft, nontender, bowel sounds present. Extremities: No pitting edema, distal pulses 2+.  ECG: I personally reviewed the tracing from 08/20/2015 which showed sinus bradycardia.  Assessment and Plan:  Paroxysmal atrial fibrillation. He is in sinus rhythm today by ECG, reports occasional palpitations. He remains on Coumadin for stroke prophylaxis, followed by Dr. Woody Seller. He also remains on propranolol for palpitations and general heart rate control. No dizziness or syncope.  Current medicines were reviewed with the patient today.   Orders Placed This Encounter  Procedures  . EKG 12-Lead    Disposition: Follow-up in 6  months.  Signed, Satira Sark, MD, [facility] 01/23/2017 2:06 PM    [facility] [place] [facility] [address] [place], [place] [address] Phone: [phone]; Fax: [phone]

## 2017-01-23 ENCOUNTER — Ambulatory Visit (INDEPENDENT_AMBULATORY_CARE_PROVIDER_SITE_OTHER): Payer: Medicare Other | Admitting: Cardiology

## 2017-01-23 ENCOUNTER — Encounter: Payer: Self-pay | Admitting: Cardiology

## 2017-01-23 VITALS — BP 122/82 | HR 80 | Ht 71.0 in | Wt 196.0 lb

## 2017-01-23 DIAGNOSIS — I480 Paroxysmal atrial fibrillation: Secondary | ICD-10-CM | POA: Diagnosis not present

## 2017-01-23 DIAGNOSIS — I2510 Atherosclerotic heart disease of native coronary artery without angina pectoris: Secondary | ICD-10-CM | POA: Diagnosis not present

## 2017-01-23 DIAGNOSIS — I10 Essential (primary) hypertension: Secondary | ICD-10-CM | POA: Diagnosis not present

## 2017-01-23 DIAGNOSIS — Z299 Encounter for prophylactic measures, unspecified: Secondary | ICD-10-CM | POA: Diagnosis not present

## 2017-01-23 DIAGNOSIS — E1165 Type 2 diabetes mellitus with hyperglycemia: Secondary | ICD-10-CM | POA: Diagnosis not present

## 2017-01-23 DIAGNOSIS — Z6827 Body mass index (BMI) 27.0-27.9, adult: Secondary | ICD-10-CM | POA: Diagnosis not present

## 2017-01-23 DIAGNOSIS — I4891 Unspecified atrial fibrillation: Secondary | ICD-10-CM | POA: Diagnosis not present

## 2017-01-23 NOTE — Patient Instructions (Addendum)
Your physician wants you to follow-up in:6 months at the [facility] office with Dr.McDowell You will receive a reminder letter in the mail two months in advance. If you don't receive a letter, please call our office to schedule the follow-up appointment.   Your physician recommends that you continue on your current medications as directed. Please refer to the Current Medication list given to you today.    If you need a refill on your cardiac medications before your next appointment, please call your pharmacy.     No lab work or tests ordered today.      Thank you for choosing [facility] !

## 2017-01-31 DIAGNOSIS — Z6826 Body mass index (BMI) 26.0-26.9, adult: Secondary | ICD-10-CM | POA: Diagnosis not present

## 2017-01-31 DIAGNOSIS — Z713 Dietary counseling and surveillance: Secondary | ICD-10-CM | POA: Diagnosis not present

## 2017-01-31 DIAGNOSIS — R109 Unspecified abdominal pain: Secondary | ICD-10-CM | POA: Diagnosis not present

## 2017-01-31 DIAGNOSIS — Z299 Encounter for prophylactic measures, unspecified: Secondary | ICD-10-CM | POA: Diagnosis not present

## 2017-02-01 ENCOUNTER — Other Ambulatory Visit: Payer: Self-pay | Admitting: Dermatology

## 2017-02-01 DIAGNOSIS — L430 Hypertrophic lichen planus: Secondary | ICD-10-CM | POA: Diagnosis not present

## 2017-02-01 DIAGNOSIS — D492 Neoplasm of unspecified behavior of bone, soft tissue, and skin: Secondary | ICD-10-CM | POA: Diagnosis not present

## 2017-02-01 DIAGNOSIS — M47816 Spondylosis without myelopathy or radiculopathy, lumbar region: Secondary | ICD-10-CM | POA: Diagnosis not present

## 2017-02-01 DIAGNOSIS — I714 Abdominal aortic aneurysm, without rupture: Secondary | ICD-10-CM | POA: Diagnosis not present

## 2017-02-01 DIAGNOSIS — M47818 Spondylosis without myelopathy or radiculopathy, sacral and sacrococcygeal region: Secondary | ICD-10-CM | POA: Diagnosis not present

## 2017-02-01 DIAGNOSIS — M160 Bilateral primary osteoarthritis of hip: Secondary | ICD-10-CM | POA: Diagnosis not present

## 2017-02-01 DIAGNOSIS — K5730 Diverticulosis of large intestine without perforation or abscess without bleeding: Secondary | ICD-10-CM | POA: Diagnosis not present

## 2017-02-01 DIAGNOSIS — Z9049 Acquired absence of other specified parts of digestive tract: Secondary | ICD-10-CM | POA: Diagnosis not present

## 2017-02-01 DIAGNOSIS — D229 Melanocytic nevi, unspecified: Secondary | ICD-10-CM | POA: Diagnosis not present

## 2017-02-01 DIAGNOSIS — L570 Actinic keratosis: Secondary | ICD-10-CM | POA: Diagnosis not present

## 2017-02-01 DIAGNOSIS — L820 Inflamed seborrheic keratosis: Secondary | ICD-10-CM | POA: Diagnosis not present

## 2017-02-01 DIAGNOSIS — I700 Atherosclerosis of aorta: Secondary | ICD-10-CM | POA: Diagnosis not present

## 2017-02-20 IMAGING — US US EXTREM LOW VENOUS*L*
1 series · 14 of 24 positions shown · non-contrast
Comparison: None.

CLINICAL DATA: Left leg swelling for 5 days.

EXAM:
LEFT LOWER EXTREMITY VENOUS DOPPLER ULTRASOUND
TECHNIQUE: Gray-scale sonography with graded compression, as well as color
Doppler and duplex ultrasound, were performed to evaluate the deep
venous system from the level of the common femoral vein through the
popliteal and proximal calf veins. Spectral Doppler was utilized to
evaluate flow at rest and with distal augmentation maneuvers.

[Series 1: us extrem low venous*left* · 0.09mm/px · 14 of 36 slices shown]
[im 1/36]
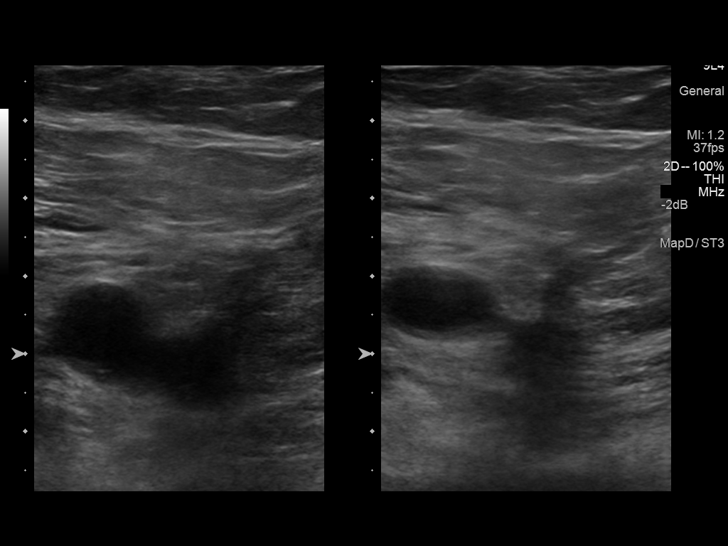
[im 4/36]
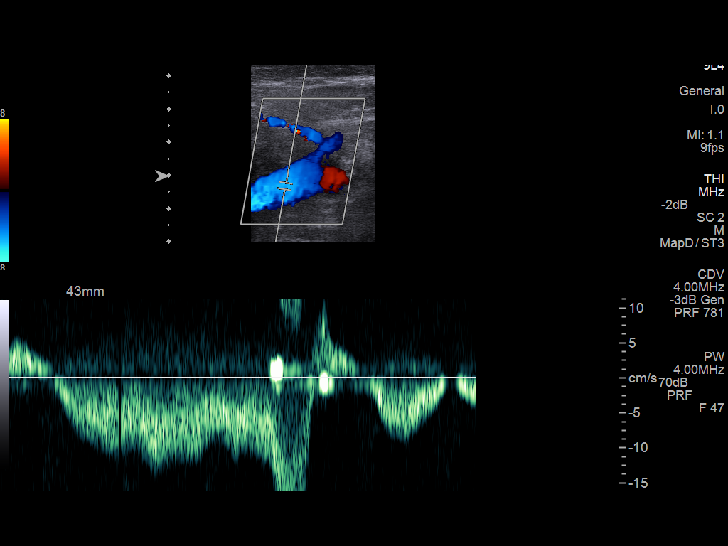
[im 7/36]
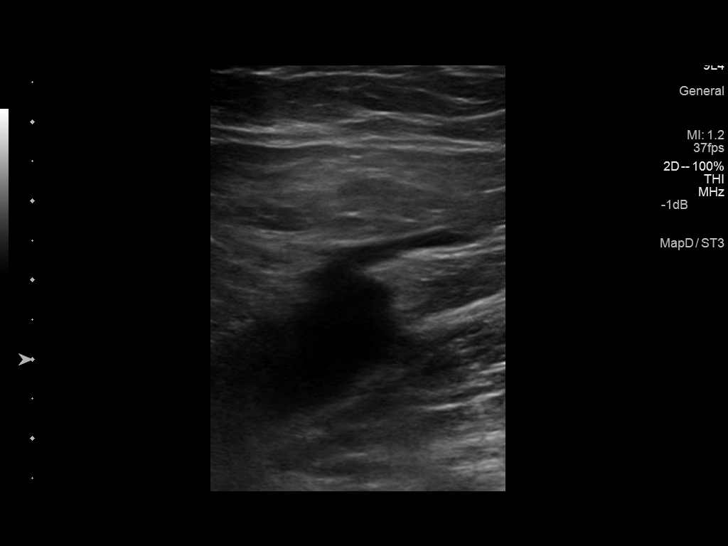
[im 10/36]
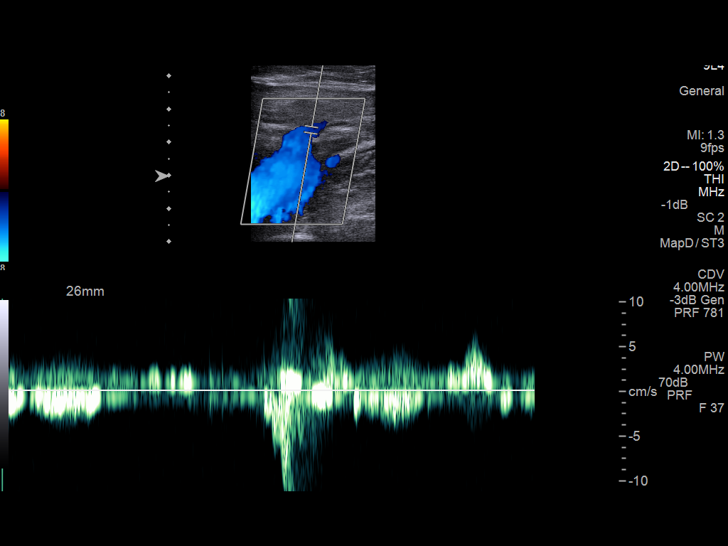
[im 11/36]
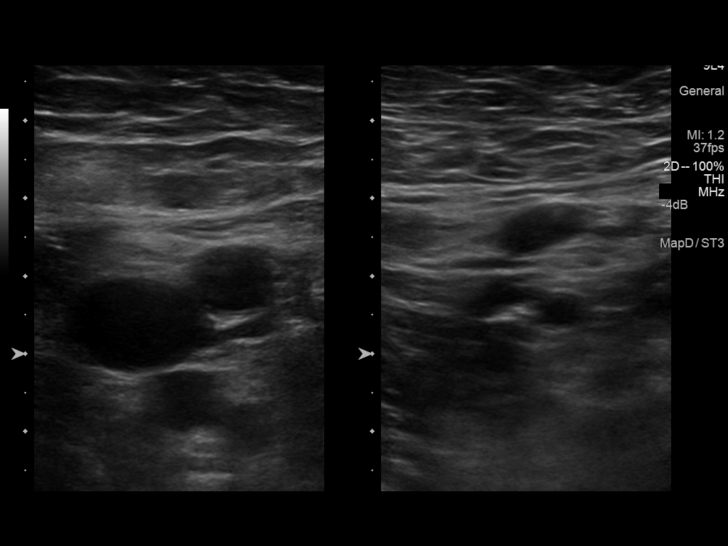
[im 14/36]
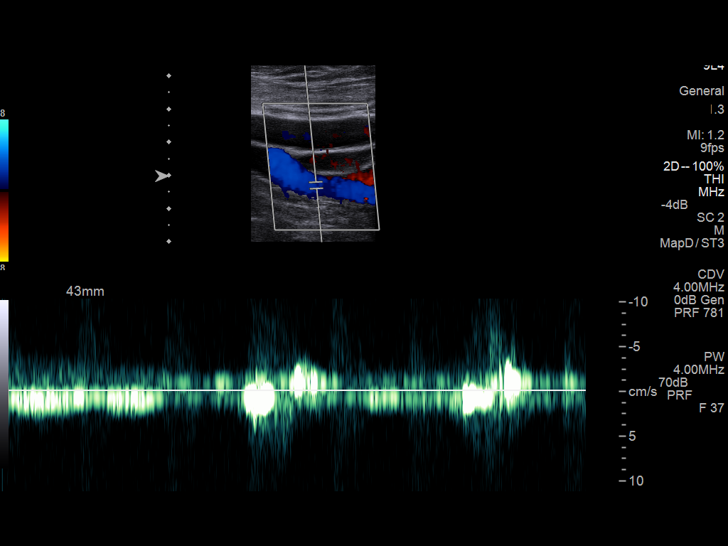
[im 17/36]
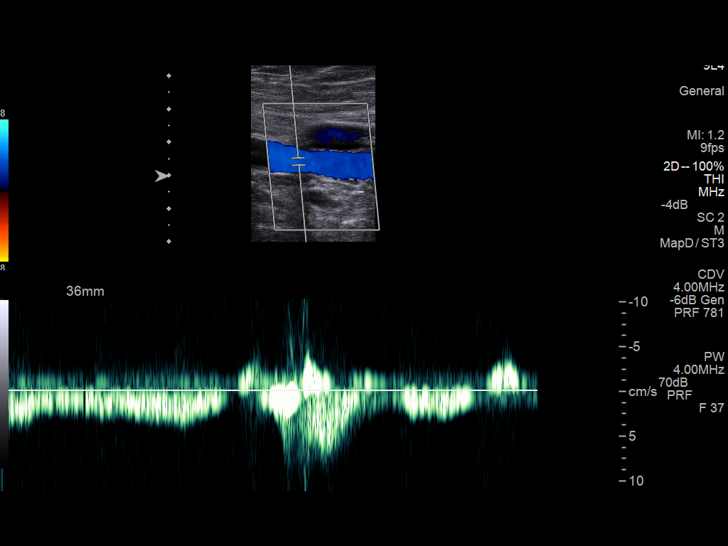
[im 19/36]
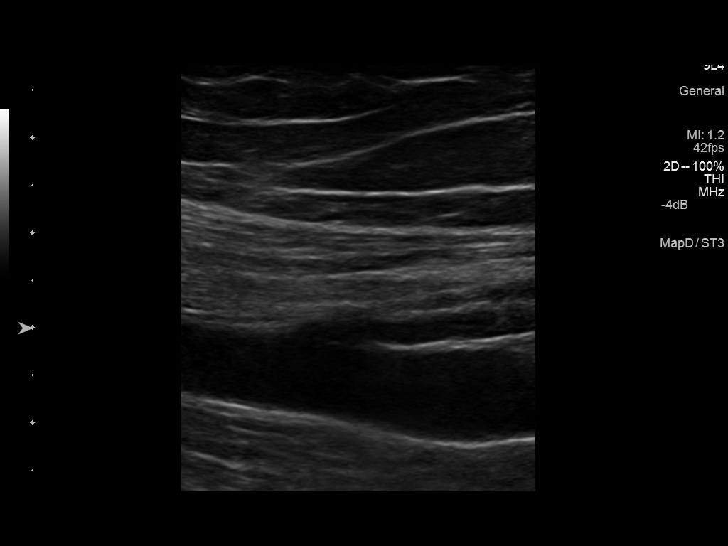
[im 22/36]
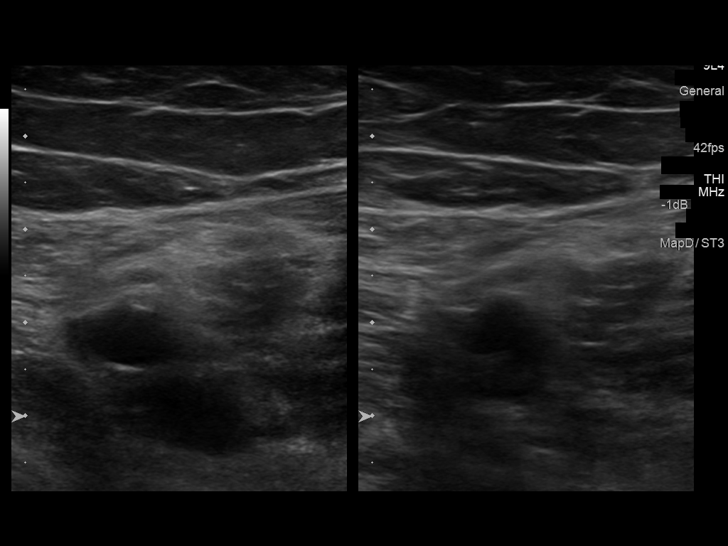
[im 25/36]
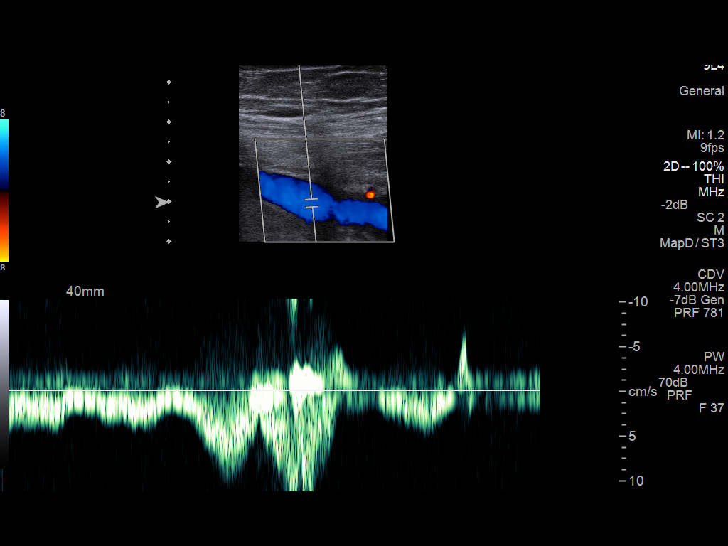
[im 28/36]
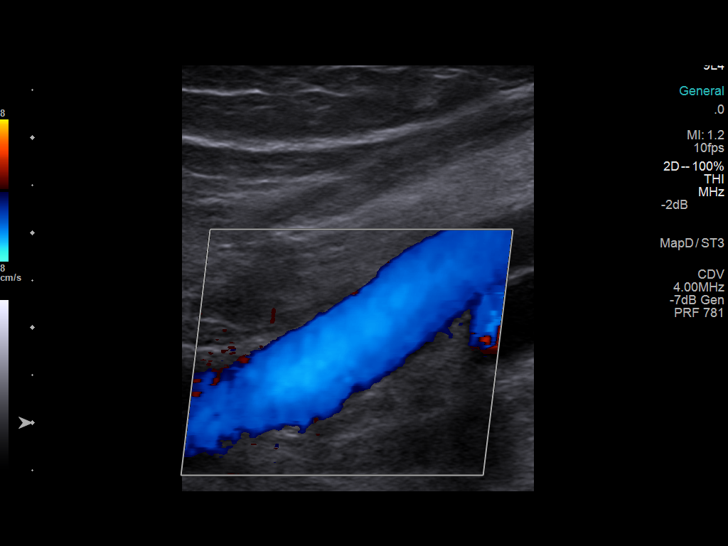
[im 29/36]
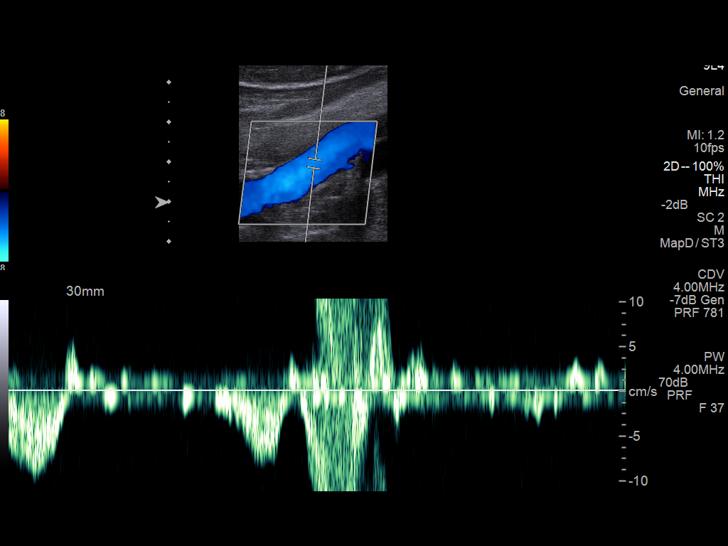
[im 32/36]
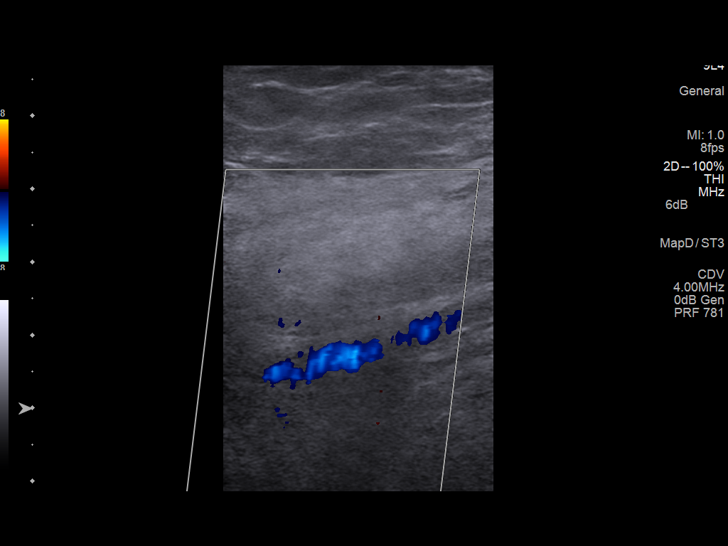
[im 36/36]
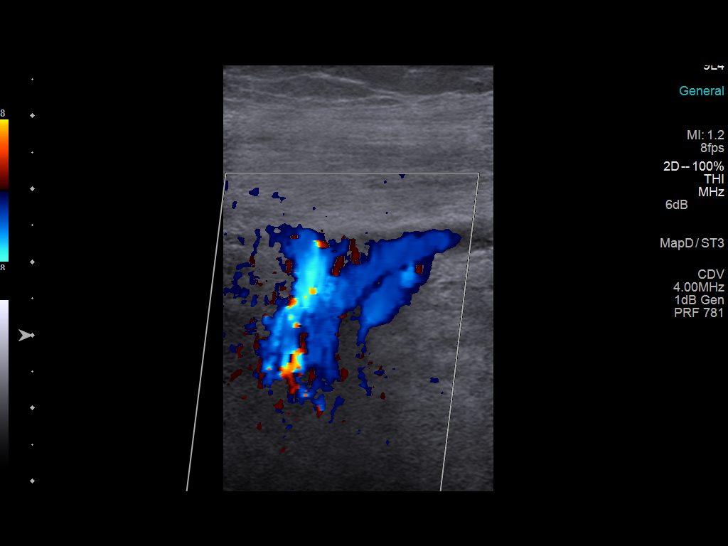

[14 of 24 positions shown; findings below may reference images not displayed]

FINDINGS: Normal compressibility, augmentation and color Doppler flow in the
left common femoral vein, left femoral vein and left popliteal vein.
The left saphenofemoral junction is patent. The visualized deep left
calf veins are patent.

The right common femoral vein is patent. The right saphenofemoral
junction is patent.
IMPRESSION: Negative for deep vein thrombosis in the left lower extremity.

## 2017-02-26 DIAGNOSIS — I4891 Unspecified atrial fibrillation: Secondary | ICD-10-CM | POA: Diagnosis not present

## 2017-04-09 DIAGNOSIS — I4891 Unspecified atrial fibrillation: Secondary | ICD-10-CM | POA: Diagnosis not present

## 2017-04-09 DIAGNOSIS — E1165 Type 2 diabetes mellitus with hyperglycemia: Secondary | ICD-10-CM | POA: Diagnosis not present

## 2017-04-09 DIAGNOSIS — Z299 Encounter for prophylactic measures, unspecified: Secondary | ICD-10-CM | POA: Diagnosis not present

## 2017-04-09 DIAGNOSIS — Z6827 Body mass index (BMI) 27.0-27.9, adult: Secondary | ICD-10-CM | POA: Diagnosis not present

## 2017-04-09 DIAGNOSIS — Z713 Dietary counseling and surveillance: Secondary | ICD-10-CM | POA: Diagnosis not present

## 2017-04-20 DIAGNOSIS — I4891 Unspecified atrial fibrillation: Secondary | ICD-10-CM | POA: Diagnosis not present

## 2017-04-20 DIAGNOSIS — Z6827 Body mass index (BMI) 27.0-27.9, adult: Secondary | ICD-10-CM | POA: Diagnosis not present

## 2017-04-20 DIAGNOSIS — I10 Essential (primary) hypertension: Secondary | ICD-10-CM | POA: Diagnosis not present

## 2017-04-20 DIAGNOSIS — M1990 Unspecified osteoarthritis, unspecified site: Secondary | ICD-10-CM | POA: Diagnosis not present

## 2017-04-20 DIAGNOSIS — Z299 Encounter for prophylactic measures, unspecified: Secondary | ICD-10-CM | POA: Diagnosis not present

## 2017-05-18 DIAGNOSIS — Z299 Encounter for prophylactic measures, unspecified: Secondary | ICD-10-CM | POA: Diagnosis not present

## 2017-05-18 DIAGNOSIS — Z6826 Body mass index (BMI) 26.0-26.9, adult: Secondary | ICD-10-CM | POA: Diagnosis not present

## 2017-05-18 DIAGNOSIS — I4891 Unspecified atrial fibrillation: Secondary | ICD-10-CM | POA: Diagnosis not present

## 2017-05-18 DIAGNOSIS — Z713 Dietary counseling and surveillance: Secondary | ICD-10-CM | POA: Diagnosis not present

## 2017-06-18 DIAGNOSIS — R1030 Lower abdominal pain, unspecified: Secondary | ICD-10-CM | POA: Diagnosis not present

## 2017-06-18 DIAGNOSIS — Z86010 Personal history of colonic polyps: Secondary | ICD-10-CM | POA: Diagnosis not present

## 2017-06-18 DIAGNOSIS — K5900 Constipation, unspecified: Secondary | ICD-10-CM | POA: Diagnosis not present

## 2017-07-04 DIAGNOSIS — B359 Dermatophytosis, unspecified: Secondary | ICD-10-CM | POA: Diagnosis not present

## 2017-07-04 DIAGNOSIS — I4891 Unspecified atrial fibrillation: Secondary | ICD-10-CM | POA: Diagnosis not present

## 2017-07-04 DIAGNOSIS — E1165 Type 2 diabetes mellitus with hyperglycemia: Secondary | ICD-10-CM | POA: Diagnosis not present

## 2017-07-04 DIAGNOSIS — Z6825 Body mass index (BMI) 25.0-25.9, adult: Secondary | ICD-10-CM | POA: Diagnosis not present

## 2017-07-04 DIAGNOSIS — Z299 Encounter for prophylactic measures, unspecified: Secondary | ICD-10-CM | POA: Diagnosis not present

## 2017-07-04 DIAGNOSIS — M1710 Unilateral primary osteoarthritis, unspecified knee: Secondary | ICD-10-CM | POA: Diagnosis not present

## 2017-07-04 DIAGNOSIS — I10 Essential (primary) hypertension: Secondary | ICD-10-CM | POA: Diagnosis not present

## 2017-07-10 DIAGNOSIS — I2510 Atherosclerotic heart disease of native coronary artery without angina pectoris: Secondary | ICD-10-CM | POA: Diagnosis not present

## 2017-07-10 DIAGNOSIS — I4891 Unspecified atrial fibrillation: Secondary | ICD-10-CM | POA: Diagnosis not present

## 2017-07-24 DIAGNOSIS — I10 Essential (primary) hypertension: Secondary | ICD-10-CM | POA: Diagnosis not present

## 2017-07-24 DIAGNOSIS — I4891 Unspecified atrial fibrillation: Secondary | ICD-10-CM | POA: Diagnosis not present

## 2017-07-24 DIAGNOSIS — Z6827 Body mass index (BMI) 27.0-27.9, adult: Secondary | ICD-10-CM | POA: Diagnosis not present

## 2017-07-24 DIAGNOSIS — E1165 Type 2 diabetes mellitus with hyperglycemia: Secondary | ICD-10-CM | POA: Diagnosis not present

## 2017-07-24 DIAGNOSIS — Z299 Encounter for prophylactic measures, unspecified: Secondary | ICD-10-CM | POA: Diagnosis not present

## 2017-08-14 DIAGNOSIS — K5900 Constipation, unspecified: Secondary | ICD-10-CM | POA: Diagnosis not present

## 2017-08-22 DIAGNOSIS — I10 Essential (primary) hypertension: Secondary | ICD-10-CM | POA: Diagnosis not present

## 2017-08-22 DIAGNOSIS — Z299 Encounter for prophylactic measures, unspecified: Secondary | ICD-10-CM | POA: Diagnosis not present

## 2017-08-22 DIAGNOSIS — I2510 Atherosclerotic heart disease of native coronary artery without angina pectoris: Secondary | ICD-10-CM | POA: Diagnosis not present

## 2017-08-22 DIAGNOSIS — E1165 Type 2 diabetes mellitus with hyperglycemia: Secondary | ICD-10-CM | POA: Diagnosis not present

## 2017-08-22 DIAGNOSIS — I4891 Unspecified atrial fibrillation: Secondary | ICD-10-CM | POA: Diagnosis not present

## 2017-08-22 DIAGNOSIS — Z6827 Body mass index (BMI) 27.0-27.9, adult: Secondary | ICD-10-CM | POA: Diagnosis not present

## 2017-08-22 DIAGNOSIS — F419 Anxiety disorder, unspecified: Secondary | ICD-10-CM | POA: Diagnosis not present

## 2017-09-19 DIAGNOSIS — Z713 Dietary counseling and surveillance: Secondary | ICD-10-CM | POA: Diagnosis not present

## 2017-09-19 DIAGNOSIS — Z6820 Body mass index (BMI) 20.0-20.9, adult: Secondary | ICD-10-CM | POA: Diagnosis not present

## 2017-09-19 DIAGNOSIS — Z299 Encounter for prophylactic measures, unspecified: Secondary | ICD-10-CM | POA: Diagnosis not present

## 2017-09-19 DIAGNOSIS — I4891 Unspecified atrial fibrillation: Secondary | ICD-10-CM | POA: Diagnosis not present

## 2017-10-10 DIAGNOSIS — M1611 Unilateral primary osteoarthritis, right hip: Secondary | ICD-10-CM | POA: Diagnosis not present

## 2017-10-10 DIAGNOSIS — M19012 Primary osteoarthritis, left shoulder: Secondary | ICD-10-CM | POA: Diagnosis not present

## 2017-10-13 DIAGNOSIS — Z23 Encounter for immunization: Secondary | ICD-10-CM | POA: Diagnosis not present

## 2017-10-18 DIAGNOSIS — Z961 Presence of intraocular lens: Secondary | ICD-10-CM | POA: Diagnosis not present

## 2017-10-18 DIAGNOSIS — H524 Presbyopia: Secondary | ICD-10-CM | POA: Diagnosis not present

## 2017-10-19 DIAGNOSIS — Z7189 Other specified counseling: Secondary | ICD-10-CM | POA: Diagnosis not present

## 2017-10-19 DIAGNOSIS — E1165 Type 2 diabetes mellitus with hyperglycemia: Secondary | ICD-10-CM | POA: Diagnosis not present

## 2017-10-19 DIAGNOSIS — Z1331 Encounter for screening for depression: Secondary | ICD-10-CM | POA: Diagnosis not present

## 2017-10-19 DIAGNOSIS — Z125 Encounter for screening for malignant neoplasm of prostate: Secondary | ICD-10-CM | POA: Diagnosis not present

## 2017-10-19 DIAGNOSIS — F419 Anxiety disorder, unspecified: Secondary | ICD-10-CM | POA: Diagnosis not present

## 2017-10-19 DIAGNOSIS — Z299 Encounter for prophylactic measures, unspecified: Secondary | ICD-10-CM | POA: Diagnosis not present

## 2017-10-19 DIAGNOSIS — Z0000 Encounter for general adult medical examination without abnormal findings: Secondary | ICD-10-CM | POA: Diagnosis not present

## 2017-10-19 DIAGNOSIS — I10 Essential (primary) hypertension: Secondary | ICD-10-CM | POA: Diagnosis not present

## 2017-10-19 DIAGNOSIS — Z79899 Other long term (current) drug therapy: Secondary | ICD-10-CM | POA: Diagnosis not present

## 2017-10-19 DIAGNOSIS — Z1339 Encounter for screening examination for other mental health and behavioral disorders: Secondary | ICD-10-CM | POA: Diagnosis not present

## 2017-10-19 DIAGNOSIS — I4891 Unspecified atrial fibrillation: Secondary | ICD-10-CM | POA: Diagnosis not present

## 2017-11-08 NOTE — Progress Notes (Signed)
Cardiology Office Note  Date: 11/09/2017   ID: Antonio Moreno, DOB 1920-07-22, MRN [id number]  PCP: Glenda Chroman, MD  Primary Cardiologist: Rozann Lesches, MD   Chief Complaint  Patient presents with  . PAF    History of Present Illness: Antonio Moreno is a 84 y.o. male last seen in December 2018.  He is here for a routine visit.  As before, he reports only he vague and infrequent sense of palpitations.  No exertional chest pain.  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  He continues to follow with Dr. Woody Seller on Coumadin.  No reported spontaneous bleeding problems.  He is otherwise on Inderal and lisinopril.  Blood pressure and heart rate are well controlled today.  I personally reviewed his ECG today which shows sinus rhythm with nonspecific ST changes and low voltage.  Past Medical History:  Diagnosis Date  . Atypical chest pain    Cardiolite 2009 without ischemia   . Colon polyps   . Internal hemorrhoids   . Mild carotid artery disease (HCC)    1-39% stenosis bilaterally  . PAF (paroxysmal atrial fibrillation) (HCC)    On Coumadin    Past Surgical History:  Procedure Laterality Date  . Abcess of Right Thigh    . CHOLECYSTECTOMY    . COLONOSCOPY N/A 11/18/2013   Procedure: COLONOSCOPY;  Surgeon: Rogene Houston, MD;  [facility];  Service: Endoscopy;  Laterality: N/A;  730  . LUMBAR LAMINECTOMY      Current Outpatient Medications  Medication Sig Dispense Refill  . doxepin (SINEQUAN) 25 MG capsule Take 12.5 mg by mouth at bedtime.     . dutasteride (AVODART) 0.5 MG capsule Take 0.5 mg by mouth daily.      Marland Kitchen lisinopril (PRINIVIL,ZESTRIL) 10 MG tablet Take 10 mg by mouth daily.    . propranolol (INDERAL LA) 60 MG 24 hr capsule Take 60 mg by mouth daily.      . silodosin (RAPAFLO) 8 MG CAPS capsule Take 8 mg by mouth once a week.     . warfarin (COUMADIN) 5 MG tablet Take 5 mg by mouth as directed. MANAGED BY PMD      No current facility-administered medications for this visit.    Allergies:  Penicillins   Social History: The patient  reports that he quit smoking about 73 years ago. His smoking use included cigarettes. He started smoking about 76 years ago. He has a 2.00 pack-year smoking history. He has never used smokeless tobacco. He reports that he does not drink alcohol or use drugs.   ROS:  Please see the history of present illness. Otherwise, complete review of systems is positive for none.  All other systems are reviewed and negative.   Physical Exam: VS:  BP 123/75   Pulse 68   Ht 6' (1.829 m)   Wt 194 lb 9.6 oz (88.3 kg)   BMI 26.39 kg/m , BMI Body mass index is 26.39 kg/m.  Wt Readings from Last 3 Encounters:  11/09/17 194 lb 9.6 oz (88.3 kg)  01/23/17 196 lb (88.9 kg)  08/20/15 192 lb 9.6 oz (87.4 kg)    General: Elderly male, appears comfortable at rest. HEENT: Conjunctiva and lids normal, oropharynx clear. Neck: Supple, no elevated JVP or carotid bruits, no thyromegaly. Lungs: Clear to auscultation, nonlabored breathing at rest. Cardiac: Regular rate and rhythm, no S3, soft systolic murmur. Abdomen: Soft, nontender, bowel sounds present.  Extremities: No pitting edema, distal pulses 2+.  ECG: I personally reviewed the tracing from 01/23/2017 which showed sinus rhythm with borderline prolonged PR interval, low voltage, decreased R wave progression.  Assessment and Plan:  Paroxysmal atrial fibrillation.  He remains clinically stable and is in sinus rhythm today by follow-up ECG.  He continues on Coumadin which she has tolerated long-term and follows with Dr. Woody Seller for management.  Continue Inderal and observation.  Current medicines were reviewed with the patient today.   Orders Placed This Encounter  Procedures  . EKG 12-Lead    Disposition: Follow-up in 6 months.  Signed, Satira Sark, MD, [facility] 11/09/2017 8:42 AM    [facility] at  [place], [place], [place] [address] Phone: [phone]; Fax: [phone]

## 2017-11-09 ENCOUNTER — Ambulatory Visit (INDEPENDENT_AMBULATORY_CARE_PROVIDER_SITE_OTHER): Payer: Medicare Other | Admitting: Cardiology

## 2017-11-09 ENCOUNTER — Encounter: Payer: Self-pay | Admitting: Cardiology

## 2017-11-09 VITALS — BP 123/75 | HR 68 | Ht 72.0 in | Wt 194.6 lb

## 2017-11-09 DIAGNOSIS — I480 Paroxysmal atrial fibrillation: Secondary | ICD-10-CM | POA: Diagnosis not present

## 2017-11-09 NOTE — Patient Instructions (Addendum)

## 2017-12-07 DIAGNOSIS — Z6827 Body mass index (BMI) 27.0-27.9, adult: Secondary | ICD-10-CM | POA: Diagnosis not present

## 2017-12-07 DIAGNOSIS — I10 Essential (primary) hypertension: Secondary | ICD-10-CM | POA: Diagnosis not present

## 2017-12-07 DIAGNOSIS — I4891 Unspecified atrial fibrillation: Secondary | ICD-10-CM | POA: Diagnosis not present

## 2017-12-07 DIAGNOSIS — E1165 Type 2 diabetes mellitus with hyperglycemia: Secondary | ICD-10-CM | POA: Diagnosis not present

## 2017-12-07 DIAGNOSIS — Z299 Encounter for prophylactic measures, unspecified: Secondary | ICD-10-CM | POA: Diagnosis not present

## 2017-12-21 DIAGNOSIS — B029 Zoster without complications: Secondary | ICD-10-CM | POA: Diagnosis not present

## 2018-01-01 ENCOUNTER — Other Ambulatory Visit: Payer: Self-pay | Admitting: Dermatology

## 2018-01-01 ENCOUNTER — Ambulatory Visit (INDEPENDENT_AMBULATORY_CARE_PROVIDER_SITE_OTHER): Payer: Medicare Other | Admitting: Urology

## 2018-01-01 DIAGNOSIS — R350 Frequency of micturition: Secondary | ICD-10-CM | POA: Diagnosis not present

## 2018-01-01 DIAGNOSIS — B009 Herpesviral infection, unspecified: Secondary | ICD-10-CM | POA: Diagnosis not present

## 2018-01-01 DIAGNOSIS — N401 Enlarged prostate with lower urinary tract symptoms: Secondary | ICD-10-CM | POA: Diagnosis not present

## 2018-01-01 DIAGNOSIS — D485 Neoplasm of uncertain behavior of skin: Secondary | ICD-10-CM | POA: Diagnosis not present

## 2018-01-01 DIAGNOSIS — L430 Hypertrophic lichen planus: Secondary | ICD-10-CM | POA: Diagnosis not present

## 2018-01-01 DIAGNOSIS — L821 Other seborrheic keratosis: Secondary | ICD-10-CM | POA: Diagnosis not present

## 2018-01-04 DIAGNOSIS — I4891 Unspecified atrial fibrillation: Secondary | ICD-10-CM | POA: Diagnosis not present

## 2018-01-04 DIAGNOSIS — Z299 Encounter for prophylactic measures, unspecified: Secondary | ICD-10-CM | POA: Diagnosis not present

## 2018-01-04 DIAGNOSIS — G4700 Insomnia, unspecified: Secondary | ICD-10-CM | POA: Diagnosis not present

## 2018-01-04 DIAGNOSIS — Z6826 Body mass index (BMI) 26.0-26.9, adult: Secondary | ICD-10-CM | POA: Diagnosis not present

## 2018-01-04 DIAGNOSIS — I10 Essential (primary) hypertension: Secondary | ICD-10-CM | POA: Diagnosis not present

## 2018-02-01 DIAGNOSIS — Z6826 Body mass index (BMI) 26.0-26.9, adult: Secondary | ICD-10-CM | POA: Diagnosis not present

## 2018-02-01 DIAGNOSIS — I4891 Unspecified atrial fibrillation: Secondary | ICD-10-CM | POA: Diagnosis not present

## 2018-02-01 DIAGNOSIS — Z299 Encounter for prophylactic measures, unspecified: Secondary | ICD-10-CM | POA: Diagnosis not present

## 2018-02-02 DIAGNOSIS — S51002A Unspecified open wound of left elbow, initial encounter: Secondary | ICD-10-CM | POA: Diagnosis not present

## 2018-02-02 DIAGNOSIS — S5002XA Contusion of left elbow, initial encounter: Secondary | ICD-10-CM | POA: Diagnosis not present

## 2018-02-02 DIAGNOSIS — S7002XA Contusion of left hip, initial encounter: Secondary | ICD-10-CM | POA: Diagnosis not present

## 2018-02-02 DIAGNOSIS — S79912A Unspecified injury of left hip, initial encounter: Secondary | ICD-10-CM | POA: Diagnosis not present

## 2018-02-02 DIAGNOSIS — I10 Essential (primary) hypertension: Secondary | ICD-10-CM | POA: Diagnosis not present

## 2018-02-02 DIAGNOSIS — S99912A Unspecified injury of left ankle, initial encounter: Secondary | ICD-10-CM | POA: Diagnosis not present

## 2018-02-02 DIAGNOSIS — S59902A Unspecified injury of left elbow, initial encounter: Secondary | ICD-10-CM | POA: Diagnosis not present

## 2018-02-02 DIAGNOSIS — M160 Bilateral primary osteoarthritis of hip: Secondary | ICD-10-CM | POA: Diagnosis not present

## 2018-02-02 DIAGNOSIS — S93492A Sprain of other ligament of left ankle, initial encounter: Secondary | ICD-10-CM | POA: Diagnosis not present

## 2018-02-04 DIAGNOSIS — I2510 Atherosclerotic heart disease of native coronary artery without angina pectoris: Secondary | ICD-10-CM | POA: Diagnosis not present

## 2018-02-04 DIAGNOSIS — I4891 Unspecified atrial fibrillation: Secondary | ICD-10-CM | POA: Diagnosis not present

## 2018-03-01 DIAGNOSIS — M25512 Pain in left shoulder: Secondary | ICD-10-CM | POA: Diagnosis not present

## 2018-03-01 DIAGNOSIS — Z6827 Body mass index (BMI) 27.0-27.9, adult: Secondary | ICD-10-CM | POA: Diagnosis not present

## 2018-03-01 DIAGNOSIS — I10 Essential (primary) hypertension: Secondary | ICD-10-CM | POA: Diagnosis not present

## 2018-03-01 DIAGNOSIS — I4891 Unspecified atrial fibrillation: Secondary | ICD-10-CM | POA: Diagnosis not present

## 2018-03-01 DIAGNOSIS — Z299 Encounter for prophylactic measures, unspecified: Secondary | ICD-10-CM | POA: Diagnosis not present

## 2018-03-01 DIAGNOSIS — I2510 Atherosclerotic heart disease of native coronary artery without angina pectoris: Secondary | ICD-10-CM | POA: Diagnosis not present

## 2018-04-09 DIAGNOSIS — I4891 Unspecified atrial fibrillation: Secondary | ICD-10-CM | POA: Diagnosis not present

## 2018-04-09 DIAGNOSIS — Z299 Encounter for prophylactic measures, unspecified: Secondary | ICD-10-CM | POA: Diagnosis not present

## 2018-04-09 DIAGNOSIS — I2510 Atherosclerotic heart disease of native coronary artery without angina pectoris: Secondary | ICD-10-CM | POA: Diagnosis not present

## 2018-04-09 DIAGNOSIS — I10 Essential (primary) hypertension: Secondary | ICD-10-CM | POA: Diagnosis not present

## 2018-04-09 DIAGNOSIS — Z6827 Body mass index (BMI) 27.0-27.9, adult: Secondary | ICD-10-CM | POA: Diagnosis not present

## 2018-04-17 DIAGNOSIS — H60501 Unspecified acute noninfective otitis externa, right ear: Secondary | ICD-10-CM | POA: Diagnosis not present

## 2018-04-17 DIAGNOSIS — H9211 Otorrhea, right ear: Secondary | ICD-10-CM | POA: Diagnosis not present

## 2018-04-17 DIAGNOSIS — H9191 Unspecified hearing loss, right ear: Secondary | ICD-10-CM | POA: Diagnosis not present

## 2018-04-17 DIAGNOSIS — H9201 Otalgia, right ear: Secondary | ICD-10-CM | POA: Diagnosis not present

## 2018-07-03 DIAGNOSIS — I4891 Unspecified atrial fibrillation: Secondary | ICD-10-CM | POA: Diagnosis not present

## 2018-07-03 DIAGNOSIS — I2510 Atherosclerotic heart disease of native coronary artery without angina pectoris: Secondary | ICD-10-CM | POA: Diagnosis not present

## 2018-07-03 DIAGNOSIS — I10 Essential (primary) hypertension: Secondary | ICD-10-CM | POA: Diagnosis not present

## 2018-07-03 DIAGNOSIS — Z6827 Body mass index (BMI) 27.0-27.9, adult: Secondary | ICD-10-CM | POA: Diagnosis not present

## 2018-07-03 DIAGNOSIS — E1165 Type 2 diabetes mellitus with hyperglycemia: Secondary | ICD-10-CM | POA: Diagnosis not present

## 2018-07-03 DIAGNOSIS — Z299 Encounter for prophylactic measures, unspecified: Secondary | ICD-10-CM | POA: Diagnosis not present

## 2018-07-12 DIAGNOSIS — I4891 Unspecified atrial fibrillation: Secondary | ICD-10-CM | POA: Diagnosis not present

## 2018-07-12 DIAGNOSIS — N390 Urinary tract infection, site not specified: Secondary | ICD-10-CM | POA: Diagnosis not present

## 2018-07-12 DIAGNOSIS — Z299 Encounter for prophylactic measures, unspecified: Secondary | ICD-10-CM | POA: Diagnosis not present

## 2018-09-04 DIAGNOSIS — K5900 Constipation, unspecified: Secondary | ICD-10-CM | POA: Diagnosis not present

## 2018-09-04 DIAGNOSIS — Z6827 Body mass index (BMI) 27.0-27.9, adult: Secondary | ICD-10-CM | POA: Diagnosis not present

## 2018-09-04 DIAGNOSIS — Z299 Encounter for prophylactic measures, unspecified: Secondary | ICD-10-CM | POA: Diagnosis not present

## 2018-09-04 DIAGNOSIS — I4891 Unspecified atrial fibrillation: Secondary | ICD-10-CM | POA: Diagnosis not present

## 2018-09-04 DIAGNOSIS — M25512 Pain in left shoulder: Secondary | ICD-10-CM | POA: Diagnosis not present

## 2018-09-10 DIAGNOSIS — Z299 Encounter for prophylactic measures, unspecified: Secondary | ICD-10-CM | POA: Diagnosis not present

## 2018-09-10 DIAGNOSIS — K5900 Constipation, unspecified: Secondary | ICD-10-CM | POA: Diagnosis not present

## 2018-09-10 DIAGNOSIS — Z713 Dietary counseling and surveillance: Secondary | ICD-10-CM | POA: Diagnosis not present

## 2018-09-10 DIAGNOSIS — Z6827 Body mass index (BMI) 27.0-27.9, adult: Secondary | ICD-10-CM | POA: Diagnosis not present

## 2018-09-10 DIAGNOSIS — R109 Unspecified abdominal pain: Secondary | ICD-10-CM | POA: Diagnosis not present

## 2018-10-08 DIAGNOSIS — I4891 Unspecified atrial fibrillation: Secondary | ICD-10-CM | POA: Diagnosis not present

## 2018-10-08 DIAGNOSIS — I2510 Atherosclerotic heart disease of native coronary artery without angina pectoris: Secondary | ICD-10-CM | POA: Diagnosis not present

## 2018-10-08 DIAGNOSIS — Z6827 Body mass index (BMI) 27.0-27.9, adult: Secondary | ICD-10-CM | POA: Diagnosis not present

## 2018-10-08 DIAGNOSIS — Z299 Encounter for prophylactic measures, unspecified: Secondary | ICD-10-CM | POA: Diagnosis not present

## 2018-10-08 DIAGNOSIS — Z713 Dietary counseling and surveillance: Secondary | ICD-10-CM | POA: Diagnosis not present

## 2018-10-21 DIAGNOSIS — Z961 Presence of intraocular lens: Secondary | ICD-10-CM | POA: Diagnosis not present

## 2018-10-21 DIAGNOSIS — H524 Presbyopia: Secondary | ICD-10-CM | POA: Diagnosis not present

## 2018-10-21 DIAGNOSIS — H53012 Deprivation amblyopia, left eye: Secondary | ICD-10-CM | POA: Diagnosis not present

## 2018-10-21 DIAGNOSIS — H35363 Drusen (degenerative) of macula, bilateral: Secondary | ICD-10-CM | POA: Diagnosis not present

## 2018-11-04 DIAGNOSIS — Z1339 Encounter for screening examination for other mental health and behavioral disorders: Secondary | ICD-10-CM | POA: Diagnosis not present

## 2018-11-04 DIAGNOSIS — Z299 Encounter for prophylactic measures, unspecified: Secondary | ICD-10-CM | POA: Diagnosis not present

## 2018-11-04 DIAGNOSIS — I2510 Atherosclerotic heart disease of native coronary artery without angina pectoris: Secondary | ICD-10-CM | POA: Diagnosis not present

## 2018-11-04 DIAGNOSIS — Z6826 Body mass index (BMI) 26.0-26.9, adult: Secondary | ICD-10-CM | POA: Diagnosis not present

## 2018-11-04 DIAGNOSIS — Z23 Encounter for immunization: Secondary | ICD-10-CM | POA: Diagnosis not present

## 2018-11-04 DIAGNOSIS — Z7189 Other specified counseling: Secondary | ICD-10-CM | POA: Diagnosis not present

## 2018-11-04 DIAGNOSIS — I10 Essential (primary) hypertension: Secondary | ICD-10-CM | POA: Diagnosis not present

## 2018-11-04 DIAGNOSIS — F419 Anxiety disorder, unspecified: Secondary | ICD-10-CM | POA: Diagnosis not present

## 2018-11-04 DIAGNOSIS — R5383 Other fatigue: Secondary | ICD-10-CM | POA: Diagnosis not present

## 2018-11-04 DIAGNOSIS — E1165 Type 2 diabetes mellitus with hyperglycemia: Secondary | ICD-10-CM | POA: Diagnosis not present

## 2018-11-04 DIAGNOSIS — I4891 Unspecified atrial fibrillation: Secondary | ICD-10-CM | POA: Diagnosis not present

## 2018-11-04 DIAGNOSIS — Z79899 Other long term (current) drug therapy: Secondary | ICD-10-CM | POA: Diagnosis not present

## 2018-11-04 DIAGNOSIS — Z125 Encounter for screening for malignant neoplasm of prostate: Secondary | ICD-10-CM | POA: Diagnosis not present

## 2018-11-04 DIAGNOSIS — Z0000 Encounter for general adult medical examination without abnormal findings: Secondary | ICD-10-CM | POA: Diagnosis not present

## 2018-11-04 DIAGNOSIS — Z1211 Encounter for screening for malignant neoplasm of colon: Secondary | ICD-10-CM | POA: Diagnosis not present

## 2018-11-04 DIAGNOSIS — Z1331 Encounter for screening for depression: Secondary | ICD-10-CM | POA: Diagnosis not present

## 2018-11-22 DIAGNOSIS — R42 Dizziness and giddiness: Secondary | ICD-10-CM | POA: Diagnosis not present

## 2018-11-22 DIAGNOSIS — H8110 Benign paroxysmal vertigo, unspecified ear: Secondary | ICD-10-CM | POA: Diagnosis not present

## 2018-11-22 DIAGNOSIS — H903 Sensorineural hearing loss, bilateral: Secondary | ICD-10-CM | POA: Diagnosis not present

## 2018-11-22 DIAGNOSIS — H905 Unspecified sensorineural hearing loss: Secondary | ICD-10-CM | POA: Diagnosis not present

## 2018-11-29 DIAGNOSIS — M169 Osteoarthritis of hip, unspecified: Secondary | ICD-10-CM | POA: Diagnosis not present

## 2018-11-29 DIAGNOSIS — M25551 Pain in right hip: Secondary | ICD-10-CM | POA: Diagnosis not present

## 2018-11-29 DIAGNOSIS — M25512 Pain in left shoulder: Secondary | ICD-10-CM | POA: Diagnosis not present

## 2018-11-29 DIAGNOSIS — M19012 Primary osteoarthritis, left shoulder: Secondary | ICD-10-CM | POA: Diagnosis not present

## 2018-11-29 DIAGNOSIS — M1611 Unilateral primary osteoarthritis, right hip: Secondary | ICD-10-CM | POA: Diagnosis not present

## 2018-12-05 DIAGNOSIS — Z299 Encounter for prophylactic measures, unspecified: Secondary | ICD-10-CM | POA: Diagnosis not present

## 2018-12-05 DIAGNOSIS — R413 Other amnesia: Secondary | ICD-10-CM | POA: Diagnosis not present

## 2018-12-05 DIAGNOSIS — I10 Essential (primary) hypertension: Secondary | ICD-10-CM | POA: Diagnosis not present

## 2018-12-05 DIAGNOSIS — I4891 Unspecified atrial fibrillation: Secondary | ICD-10-CM | POA: Diagnosis not present

## 2018-12-05 DIAGNOSIS — Z6827 Body mass index (BMI) 27.0-27.9, adult: Secondary | ICD-10-CM | POA: Diagnosis not present

## 2018-12-09 DIAGNOSIS — M25551 Pain in right hip: Secondary | ICD-10-CM | POA: Diagnosis not present

## 2018-12-09 DIAGNOSIS — M25512 Pain in left shoulder: Secondary | ICD-10-CM | POA: Diagnosis not present

## 2018-12-09 DIAGNOSIS — M25519 Pain in unspecified shoulder: Secondary | ICD-10-CM | POA: Diagnosis not present

## 2018-12-31 DIAGNOSIS — I10 Essential (primary) hypertension: Secondary | ICD-10-CM | POA: Diagnosis not present

## 2018-12-31 DIAGNOSIS — R413 Other amnesia: Secondary | ICD-10-CM | POA: Diagnosis not present

## 2018-12-31 DIAGNOSIS — Z6827 Body mass index (BMI) 27.0-27.9, adult: Secondary | ICD-10-CM | POA: Diagnosis not present

## 2018-12-31 DIAGNOSIS — Z299 Encounter for prophylactic measures, unspecified: Secondary | ICD-10-CM | POA: Diagnosis not present

## 2018-12-31 DIAGNOSIS — Z713 Dietary counseling and surveillance: Secondary | ICD-10-CM | POA: Diagnosis not present

## 2019-02-14 DIAGNOSIS — Z23 Encounter for immunization: Secondary | ICD-10-CM | POA: Diagnosis not present

## 2019-02-21 ENCOUNTER — Other Ambulatory Visit: Payer: Self-pay

## 2019-02-21 ENCOUNTER — Telehealth: Payer: Self-pay | Admitting: Urology

## 2019-02-21 DIAGNOSIS — N401 Enlarged prostate with lower urinary tract symptoms: Secondary | ICD-10-CM

## 2019-02-21 MED ORDER — SILODOSIN 8 MG PO CAPS: 8.0000 mg | ORAL_CAPSULE | ORAL | 2 refills | Status: DC

## 2019-02-21 MED ORDER — SILODOSIN 8 MG PO CAPS: 8.0000 mg | ORAL_CAPSULE | Freq: Every day | ORAL | 2 refills | Status: DC

## 2019-02-21 NOTE — Telephone Encounter (Signed)
Pt called for a refill request. States he will be leaving out of town after the first of the week and would like it by then.

## 2019-02-21 NOTE — Telephone Encounter (Signed)
-----   Message from Dorisann Frames, RN sent at 02/21/2019 11:38 AM EST ----- Needs appt with Dr. Diona Fanti. Next available for 1 yr f/u

## 2019-02-21 NOTE — Telephone Encounter (Signed)
Patient will call back after April 1st to schedule an appt.  Says he will be out of town and when he returns he will call back.  Thanks

## 2019-02-21 NOTE — Telephone Encounter (Signed)
Spoke with pt. Needs refill on rapaflo. Pt will get scheduled as well for 1 year f/u. Refills given until pt can see MD.

## 2019-02-25 DIAGNOSIS — E1165 Type 2 diabetes mellitus with hyperglycemia: Secondary | ICD-10-CM | POA: Diagnosis not present

## 2019-02-25 DIAGNOSIS — I10 Essential (primary) hypertension: Secondary | ICD-10-CM | POA: Diagnosis not present

## 2019-02-25 DIAGNOSIS — Z6827 Body mass index (BMI) 27.0-27.9, adult: Secondary | ICD-10-CM | POA: Diagnosis not present

## 2019-02-25 DIAGNOSIS — N400 Enlarged prostate without lower urinary tract symptoms: Secondary | ICD-10-CM | POA: Diagnosis not present

## 2019-02-25 DIAGNOSIS — G4700 Insomnia, unspecified: Secondary | ICD-10-CM | POA: Diagnosis not present

## 2019-02-25 DIAGNOSIS — I4891 Unspecified atrial fibrillation: Secondary | ICD-10-CM | POA: Diagnosis not present

## 2019-02-25 DIAGNOSIS — Z299 Encounter for prophylactic measures, unspecified: Secondary | ICD-10-CM | POA: Diagnosis not present

## 2019-03-07 DIAGNOSIS — Z23 Encounter for immunization: Secondary | ICD-10-CM | POA: Diagnosis not present

## 2019-05-19 ENCOUNTER — Ambulatory Visit (INDEPENDENT_AMBULATORY_CARE_PROVIDER_SITE_OTHER): Payer: Medicare Other | Admitting: Dermatology

## 2019-05-19 ENCOUNTER — Encounter: Payer: Self-pay | Admitting: Dermatology

## 2019-05-19 ENCOUNTER — Other Ambulatory Visit: Payer: Self-pay

## 2019-05-19 DIAGNOSIS — D225 Melanocytic nevi of trunk: Secondary | ICD-10-CM | POA: Diagnosis not present

## 2019-05-19 DIAGNOSIS — D485 Neoplasm of uncertain behavior of skin: Secondary | ICD-10-CM

## 2019-05-19 DIAGNOSIS — Z1283 Encounter for screening for malignant neoplasm of skin: Secondary | ICD-10-CM

## 2019-05-19 DIAGNOSIS — D492 Neoplasm of unspecified behavior of bone, soft tissue, and skin: Secondary | ICD-10-CM

## 2019-05-19 DIAGNOSIS — D229 Melanocytic nevi, unspecified: Secondary | ICD-10-CM

## 2019-05-19 DIAGNOSIS — L821 Other seborrheic keratosis: Secondary | ICD-10-CM | POA: Diagnosis not present

## 2019-05-19 DIAGNOSIS — L570 Actinic keratosis: Secondary | ICD-10-CM

## 2019-05-19 NOTE — Patient Instructions (Addendum)
Waist up skin examination on my old friend home her right.  Tubal benign keratoses particularly on the torso; largest 1 right outer front rib cage.  These do not require removal.  Small actinic keratoses will be ignored; larger spots on scalp will be treated with liquid nitrogen freeze.  You can expect these will swell and they may peel in the next 2 weeks.  No special care.  1 smooth black spot above the right inner collarbone was biopsied.  We will call you later this week if this shows anything that requires follow-up.  The easy bruising on your arms is related both to the Coumadin and minor sun damage.  If this is bothersome, you may try a nonprescription cream called Dermend which can be gotten either at a pharmacy or on [employer].  Overall, you look 20 years younger than your age and as always, it was a real pleasure to see you.  Biopsy, Surgery (Curettage) & Surgery (Excision) Aftercare Instructions  1. Okay to remove bandage in 24 hours  2. Wash area with soap and water  3. Apply Vaseline to area twice daily until healed (Not Neosporin)  4. Okay to cover with a Band-Aid to decrease the chance of infection or prevent irritation from clothing; also it's okay to uncover lesion at home.  5. Suture instructions: return to our office in 7-10 or 10-14 days for a nurse visit for suture removal. Variable healing with sutures, if pain or itching occurs call our office. It's okay to shower or bathe 24 hours after sutures are given.  6. The following risks may occur after a biopsy, curettage or excision: bleeding, scarring, discoloration, recurrence, infection (redness, yellow drainage, pain or swelling).  7. For questions, concerns and results call our office at [facility] before 4pm & Friday before 3pm. Biopsy results will be available in 1 week.

## 2019-05-19 NOTE — Progress Notes (Addendum)
   Follow-Up Visit   Subjective  Antonio Moreno is a 86 y.o. male who presents for the following: Annual Exam (Places across forehead and cheeks. No itching or bleeding.).  New spots Location: scalp, face Duration: 6 months Quality: Increased size Associated Signs/Symptoms: Some soreness Modifying Factors:  Severity:  Timing: Context:   The following portions of the chart were reviewed this encounter and updated as appropriate: Tobacco  Allergies  Meds  Problems  Med Hx  Surg Hx  Fam Hx      Objective  Well appearing patient in no apparent distress; mood and affect are within normal limits.  All skin waist up examined.   Assessment & Plan  AK (actinic keratosis) (2) Mid Frontal Scalp; Right Parotid Area  Destruction of lesion - Mid Frontal Scalp, Right Parotid Area  Destruction method: cryotherapy   Informed consent: discussed and consent obtained   Lesion destroyed using liquid nitrogen: Yes   Cryotherapy cycles:  5 Outcome: patient tolerated procedure well with no complications    Neoplasm of skin above right, inner collarbone  Skin / nail biopsy Type of biopsy: tangential   Informed consent: discussed and consent obtained   Anesthesia: the lesion was anesthetized in a standard fashion   Anesthetic:  1% lidocaine w/ epinephrine 1-100,000 local infiltration Instrument used: flexible razor blade   Hemostasis achieved with: ferric subsulfate   Outcome: patient tolerated procedure well   Post-procedure details: sterile dressing applied and wound care instructions given   Dressing type: bandage and petrolatum   Additional details:  Patient identified lesion of concern.  Lesion identified by physician.  Specimen 1 - Surgical pathology Differential Diagnosis: atypia Check Margins: No  Nevus (2) Left Lower Back; Right Lower Back  Annual check

## 2019-05-20 ENCOUNTER — Telehealth: Payer: Self-pay | Admitting: Cardiology

## 2019-05-20 ENCOUNTER — Encounter: Payer: Self-pay | Admitting: Urology

## 2019-05-20 ENCOUNTER — Ambulatory Visit (INDEPENDENT_AMBULATORY_CARE_PROVIDER_SITE_OTHER): Payer: Medicare Other | Admitting: Urology

## 2019-05-20 VITALS — BP 124/81 | HR 78 | Temp 96.8°F | Ht 71.5 in | Wt 192.0 lb

## 2019-05-20 DIAGNOSIS — N401 Enlarged prostate with lower urinary tract symptoms: Secondary | ICD-10-CM

## 2019-05-20 LAB — POCT URINALYSIS DIPSTICK
Bilirubin, UA: NEGATIVE
Glucose, UA: NEGATIVE
Ketones, UA: NEGATIVE
Nitrite, UA: NEGATIVE
Protein, UA: NEGATIVE
Spec Grav, UA: <=1.005 — AB (ref 1.010–1.025)
Urobilinogen, UA: 0.2 E.U./dL
pH, UA: 6.5 (ref 5.0–8.0)

## 2019-05-20 LAB — BLADDER SCAN AMB NON-IMAGING: Scan Result: 50.2

## 2019-05-20 NOTE — Progress Notes (Signed)
H&P  Chief Complaint: BPH  History of Present Illness:   4.13.2021: Here today for follow-up. He reports having had slightly worsened urinary sx's w/ increased daytime freq. He has had good days and bad days with regards to this but is bothered when it gets to be around a q 1.5 hr freq. He remains on silodosin + dutasteride and denies any issues with side effects such as dizziness or issues with balance. He remains quite active and is still working 6 days a week. He denies any issues with mobility when it comes to being able to reach the restroom. He continues to express concerns over worsening memory as noted at last visit.   IPSS Questionnaire (AUA-7): Over the past month.   1)  How often have you had a sensation of not emptying your bladder completely after you finish urinating?  2 - Less than half the time  2)  How often have you had to urinate again less than two hours after you finished urinating? 3 - About half the time  3)  How often have you found you stopped and started again several times when you urinated?  2 - Less than half the time  4) How difficult have you found it to postpone urination?  1 - Less than 1 time in 5  5) How often have you had a weak urinary stream?  0 - Not at all  6) How often have you had to push or strain to begin urination?  2 - Less than half the time  7) How many times did you most typically get up to urinate from the time you went to bed until the time you got up in the morning?  1 - 1 time  Total score:  0-7 mildly symptomatic   8-19 moderately symptomatic   20-35 severely symptomatic   Total: 11 QoL: 2  (below copied from [facility] records):  BPH:   Antonio Moreno is a 86 year-old male established patient who is here for follow up regarding further evaluation of BPH and lower urinary tract symptoms.  Patient is currently treated with Dutasteride, Rapaflo for his symptoms.   Currently he is on both Rapaflo and Avodart. Overall, stable symptomatology.    He still works 5 days a week. He states quite active. He is worried a bit about some memory issues and weakness. However, not too concerning for an active 86 year old male.    Past Medical History:  Diagnosis Date  . Atypical chest pain    Cardiolite 2009 without ischemia   . Colon polyps   . Internal hemorrhoids   . Mild carotid artery disease (HCC)    1-39% stenosis bilaterally  . PAF (paroxysmal atrial fibrillation) (HCC)    On Coumadin  . Squamous cell carcinoma of skin 05/11/2009   Right sideburn - tx p bx    Past Surgical History:  Procedure Laterality Date  . Abcess of Right Thigh    . CHOLECYSTECTOMY    . COLONOSCOPY N/A 11/18/2013   Procedure: COLONOSCOPY;  Surgeon: Rogene Houston, MD;  [facility];  Service: Endoscopy;  Laterality: N/A;  730  . LUMBAR LAMINECTOMY      Home Medications:  Allergies as of 05/20/2019      Reactions   Penicillins    REACTION: rash      Medication List       Accurate as of May 20, 2019  8:27 AM. If you have any questions, ask your nurse or doctor.  doxepin 25 MG capsule Commonly known as: SINEQUAN Take 12.5 mg by mouth at bedtime.   dutasteride 0.5 MG capsule Commonly known as: AVODART Take 0.5 mg by mouth daily.   lisinopril 10 MG tablet Commonly known as: ZESTRIL Take 10 mg by mouth daily.   propranolol ER 60 MG 24 hr capsule Commonly known as: INDERAL LA Take 60 mg by mouth daily.   silodosin 8 MG Caps capsule Commonly known as: RAPAFLO Take 1 capsule (8 mg total) by mouth daily.   warfarin 5 MG tablet Commonly known as: COUMADIN Take 5 mg by mouth as directed. MANAGED BY PMD       Allergies:  Allergies  Allergen Reactions  . Penicillins     REACTION: rash    Family History  Problem Relation Age of Onset  . Colon cancer Neg Hx   . Colon polyps Neg Hx     Social History:  reports that he quit smoking about 75 years ago. His smoking use included cigarettes. He started smoking  about 77 years ago. He has a 2.00 pack-year smoking history. He has never used smokeless tobacco. He reports that he does not drink alcohol or use drugs.  ROS: A complete review of systems was performed.  All systems are negative except for pertinent findings as noted.  Physical Exam:  Vital signs in last 24 hours: There were no vitals taken for this visit. Constitutional:  Alert and oriented, No acute distress Cardiovascular: Regular rate  Respiratory: Normal respiratory effort GI: Abdomen is soft, nontender, nondistended, no abdominal masses. No CVAT.  Genitourinary: Normal male phallus, testes are descended bilaterally and non-tender and without masses and atrophic, scrotum is normal in appearance without lesions or masses, perineum is normal on inspection. Prostate estimated by DRE to be around 50 grams. Lymphatic: No lymphadenopathy Neurologic: Grossly intact, no focal deficits Psychiatric: Normal mood and affect  I have reviewed prior pt notes  I have reviewed notes from referring/previous physicians  I have reviewed urinalysis results  I have independently reviewed prior imaging  I have reviewed prior PSA results  I have reviewed prior urine culture Impression/Assessment:  Slightly worsening urinary sx's (namely frequency). PVR today low. On dual medical therapy with minimal adverse effects. DRE today normal.   Plan:  1. Continue on dual medical therapy.   2. Return for OV in 1 yr  3. Advised to maintain appropriate levels of physical activity and cardiovascular exercise -- he may benefit from a recumbent bike or other exercises that would not put pressure on his joints.

## 2019-05-20 NOTE — Progress Notes (Signed)
Urological Symptom Review  Patient is experiencing the following symptoms: Get up at night to urinate Stream starts and stops   Review of Systems  Gastrointestinal (upper)  : Negative for upper GI symptoms  Gastrointestinal (lower) : Constipation  Constitutional : Negative for symptoms  Skin: Negative for skin symptoms  Eyes: Negative for eye symptoms  Ear/Nose/Throat : Negative for Ear/Nose/Throat symptoms  Hematologic/Lymphatic: Negative for Hematologic/Lymphatic symptoms  Cardiovascular : Negative for cardiovascular symptoms  Respiratory : Negative for respiratory symptoms  Endocrine: Negative for endocrine symptoms  Musculoskeletal: Back pain  Neurological: Negative for neurological symptoms  Psychologic: Negative for psychiatric symptoms

## 2019-05-20 NOTE — Telephone Encounter (Signed)
  Patient Consent for Virtual Visit         Antonio Moreno has provided verbal consent on 05/20/2019 for a virtual visit (video or telephone).   CONSENT FOR VIRTUAL VISIT FOR:  Antonio Moreno  By participating in this virtual visit I agree to the following:  I hereby voluntarily request, consent and authorize [facility] and its employed or contracted physicians, physician assistants, nurse practitioners or other licensed health care professionals (the Practitioner), to provide me with telemedicine health care services (the "Services") as deemed necessary by the treating Practitioner. I acknowledge and consent to receive the Services by the Practitioner via telemedicine. I understand that the telemedicine visit will involve communicating with the Practitioner through live audiovisual communication technology and the disclosure of certain medical information by electronic transmission. I acknowledge that I have been given the opportunity to request an in-person assessment or other available alternative prior to the telemedicine visit and am voluntarily participating in the telemedicine visit.  I understand that I have the right to withhold or withdraw my consent to the use of telemedicine in the course of my care at any time, without affecting my right to future care or treatment, and that the Practitioner or I may terminate the telemedicine visit at any time. I understand that I have the right to inspect all information obtained and/or recorded in the course of the telemedicine visit and may receive copies of available information for a reasonable fee.  I understand that some of the potential risks of receiving the Services via telemedicine include:  Marland Kitchen Delay or interruption in medical evaluation due to technological equipment failure or disruption; . Information transmitted may not be sufficient (e.g. poor resolution of images) to allow for appropriate medical decision making by the Practitioner;  and/or  . In rare instances, security protocols could fail, causing a breach of personal health information.  Furthermore, I acknowledge that it is my responsibility to provide information about my medical history, conditions and care that is complete and accurate to the best of my ability. I acknowledge that Practitioner's advice, recommendations, and/or decision may be based on factors not within their control, such as incomplete or inaccurate data provided by me or distortions of diagnostic images or specimens that may result from electronic transmissions. I understand that the practice of medicine is not an exact science and that Practitioner makes no warranties or guarantees regarding treatment outcomes. I acknowledge that a copy of this consent can be made available to me via my patient portal ([facility]), or I can request a printed copy by calling the office of [facility].    I understand that my insurance will be billed for this visit.   I have read or had this consent read to me. . I understand the contents of this consent, which adequately explains the benefits and risks of the Services being provided via telemedicine.  . I have been provided ample opportunity to ask questions regarding this consent and the Services and have had my questions answered to my satisfaction. . I give my informed consent for the services to be provided through the use of telemedicine in my medical care

## 2019-05-22 ENCOUNTER — Telehealth (INDEPENDENT_AMBULATORY_CARE_PROVIDER_SITE_OTHER): Payer: Medicare Other | Admitting: Cardiology

## 2019-05-22 ENCOUNTER — Encounter: Payer: Self-pay | Admitting: Cardiology

## 2019-05-22 VITALS — BP 124/81 | HR 78 | Temp 96.8°F | Ht 71.5 in | Wt 192.0 lb

## 2019-05-22 DIAGNOSIS — Z299 Encounter for prophylactic measures, unspecified: Secondary | ICD-10-CM | POA: Diagnosis not present

## 2019-05-22 DIAGNOSIS — L723 Sebaceous cyst: Secondary | ICD-10-CM | POA: Diagnosis not present

## 2019-05-22 DIAGNOSIS — Z789 Other specified health status: Secondary | ICD-10-CM | POA: Diagnosis not present

## 2019-05-22 DIAGNOSIS — I480 Paroxysmal atrial fibrillation: Secondary | ICD-10-CM | POA: Diagnosis not present

## 2019-05-22 DIAGNOSIS — I4891 Unspecified atrial fibrillation: Secondary | ICD-10-CM | POA: Diagnosis not present

## 2019-05-22 DIAGNOSIS — I10 Essential (primary) hypertension: Secondary | ICD-10-CM | POA: Diagnosis not present

## 2019-05-22 DIAGNOSIS — L089 Local infection of the skin and subcutaneous tissue, unspecified: Secondary | ICD-10-CM | POA: Diagnosis not present

## 2019-05-22 DIAGNOSIS — E1165 Type 2 diabetes mellitus with hyperglycemia: Secondary | ICD-10-CM | POA: Diagnosis not present

## 2019-05-22 NOTE — Progress Notes (Signed)
Virtual Visit via Telephone Note   This visit type was conducted due to national recommendations for restrictions regarding the COVID-19 Pandemic (e.g. social distancing) in an effort to limit this patient's exposure and mitigate transmission in our community.  Due to his co-morbid illnesses, this patient is at least at moderate risk for complications without adequate follow up.  This format is felt to be most appropriate for this patient at this time.  The patient did not have access to video technology/had technical difficulties with video requiring transitioning to audio format only (telephone).  All issues noted in this document were discussed and addressed.  No physical exam could be performed with this format.  Please refer to the patient's chart for his  consent to telehealth for [facility].   The patient was identified using 2 identifiers.  Date:  05/22/2019   ID:  Antonio Moreno, DOB [birth year], MRN [id number]  Patient Location: Home Provider Location: Office  PCP:  Glenda Chroman, MD  Cardiologist:  Rozann Lesches, MD Electrophysiologist:  None   Evaluation Performed:  Follow-Up Visit  Chief Complaint:  Cardiac follow-up  History of Present Illness:    Antonio Moreno is a 86 y.o. male last seen in October 2019.  We spoke by phone today.  He states that he has had intermittent feelings of skipped heartbeats, also troubled by left shoulder pain, states that he was told that he needed to have a joint replacement but does not plan on pursuing this.  He has had no dizziness or syncope.  He remains active, does a lot of traveling, has spent time in [place] and also in [place].  He continues on Coumadin with follow-up by Dr. Woody Seller.  He does not report any bleeding problems.  The patient does not have symptoms concerning for COVID-19 infection (fever, chills, cough, or new shortness of breath).    Past Medical History:  Diagnosis Date  . Atypical chest pain    Cardiolite  2009 without ischemia   . Colon polyps   . Internal hemorrhoids   . Mild carotid artery disease (HCC)    1-39% stenosis bilaterally  . PAF (paroxysmal atrial fibrillation) (HCC)    On Coumadin  . Squamous cell carcinoma of skin 05/11/2009   Right sideburn - tx p bx   Past Surgical History:  Procedure Laterality Date  . Abcess of Right Thigh    . CHOLECYSTECTOMY    . COLONOSCOPY N/A 11/18/2013   Procedure: COLONOSCOPY;  Surgeon: Rogene Houston, MD;  [facility];  Service: Endoscopy;  Laterality: N/A;  730  . LUMBAR LAMINECTOMY       Current Meds  Medication Sig  . clonazePAM (KLONOPIN) 0.5 MG tablet Take 0.5 mg by mouth as needed for anxiety.  Marland Kitchen doxepin (SINEQUAN) 25 MG capsule Take 12.5 mg by mouth at bedtime.   . dutasteride (AVODART) 0.5 MG capsule Take 0.5 mg by mouth daily.    . flurazepam (DALMANE) 15 MG capsule Take 15 mg by mouth at bedtime as needed.  Marland Kitchen lisinopril (PRINIVIL,ZESTRIL) 10 MG tablet Take 10 mg by mouth daily.  . propranolol (INDERAL LA) 60 MG 24 hr capsule Take 60 mg by mouth daily.    . silodosin (RAPAFLO) 8 MG CAPS capsule Take 1 capsule (8 mg total) by mouth daily.  Marland Kitchen warfarin (COUMADIN) 5 MG tablet Take 5 mg by mouth as directed. MANAGED BY PMD     Allergies:   Penicillins   ROS: No syncope.  Prior CV studies:   The following studies were reviewed today:  No interval cardiac testing for review today.  Labs/Other Tests and Data Reviewed:    EKG:  An ECG dated 11/09/2017 was personally reviewed today and demonstrated:  Sinus rhythm with nonspecific ST changes and low voltage.  Recent Labs:  No interval lab work for review today.  Wt Readings from Last 3 Encounters:  05/22/19 192 lb (87.1 kg)  05/20/19 192 lb (87.1 kg)  11/09/17 194 lb 9.6 oz (88.3 kg)     Objective:    Vital Signs:  BP 124/81   Pulse 78   Temp (!) 96.8 F (36 C)   Ht 5' 11.5" (1.816 m)   Wt 192 lb (87.1 kg)   BMI 26.41 kg/m    Patient spoke in full  sentences, not obviously short of breath. No audible wheezing or coughing.  ASSESSMENT & PLAN:    Paroxysmal atrial fibrillation by history.  CHA2DS2-VASc score is at least 3.  He reports intermittent palpitations, we will have him come by the office to get an ECG in comparison to last tracing from 2019.  He has been on Coumadin long-term for anticoagulation and he continues to follow with Dr. Woody Seller.  He remains on Inderal LA.   Time:   Today, I have spent 5 minutes with the patient with telehealth technology discussing the above problems.     Medication Adjustments/Labs and Tests Ordered: Current medicines are reviewed at length with the patient today.  Concerns regarding medicines are outlined above.   Tests Ordered: No orders of the defined types were placed in this encounter.   Medication Changes: No orders of the defined types were placed in this encounter.   Follow Up:  In Person 4 to 6 weeks in the [place] office.  Signed, Rozann Lesches, MD  05/22/2019 2:50 PM    [place]

## 2019-05-22 NOTE — Patient Instructions (Signed)
Medication Instructions:  Your physician recommends that you continue on your current medications as directed. Please refer to the Current Medication list given to you today.   Labwork: none  Testing/Procedures: Please have EKG done @ [place] office tomorrow   Follow-Up: Your physician recommends that you schedule a follow-up appointment in: nurse visit tomorrow [facility])  Your physician recommends that you schedule a follow-up appointment in: 4-6 weeks in [place]     Any Other Special Instructions Will Be Listed Below (If Applicable).     If you need a refill on your cardiac medications before your next appointment, please call your pharmacy.

## 2019-05-23 ENCOUNTER — Telehealth: Payer: Self-pay | Admitting: *Deleted

## 2019-05-23 ENCOUNTER — Other Ambulatory Visit: Payer: Self-pay

## 2019-05-23 ENCOUNTER — Ambulatory Visit (INDEPENDENT_AMBULATORY_CARE_PROVIDER_SITE_OTHER): Payer: Medicare Other | Admitting: *Deleted

## 2019-05-23 DIAGNOSIS — I480 Paroxysmal atrial fibrillation: Secondary | ICD-10-CM

## 2019-05-23 NOTE — Progress Notes (Signed)
ECG shows sinus bradycardia, please let him know that he is not in atrial fibrillation.

## 2019-05-23 NOTE — Progress Notes (Signed)
Pt aware.

## 2019-05-23 NOTE — Telephone Encounter (Signed)
Patient informed. Copy sent to PCP °

## 2019-05-23 NOTE — Progress Notes (Signed)
Pt here for EKG per recent telehealth appt with Dr Domenic Polite - EKG done and will forward to Dr Domenic Polite

## 2019-05-23 NOTE — Telephone Encounter (Signed)
-----   Message from Satira Sark, MD sent at 05/23/2019 10:44 AM EDT ----- Results reviewed.  ECG shows sinus bradycardia with increased voltage and repolarization changes.

## 2019-06-25 DIAGNOSIS — I10 Essential (primary) hypertension: Secondary | ICD-10-CM | POA: Diagnosis not present

## 2019-06-25 DIAGNOSIS — Z299 Encounter for prophylactic measures, unspecified: Secondary | ICD-10-CM | POA: Diagnosis not present

## 2019-06-25 DIAGNOSIS — M169 Osteoarthritis of hip, unspecified: Secondary | ICD-10-CM | POA: Diagnosis not present

## 2019-06-25 DIAGNOSIS — E1165 Type 2 diabetes mellitus with hyperglycemia: Secondary | ICD-10-CM | POA: Diagnosis not present

## 2019-06-25 DIAGNOSIS — I4891 Unspecified atrial fibrillation: Secondary | ICD-10-CM | POA: Diagnosis not present

## 2019-06-26 NOTE — Progress Notes (Signed)
Cardiology Office Note  Date: 06/27/2019   ID: Antonio Moreno, DOB May 08, 1920, MRN [id number]  PCP:  Antonio Chroman, MD  Cardiologist:  Antonio Lesches, MD Electrophysiologist:  None   Chief Complaint  Patient presents with  . Atrial Fibrillation    History of Present Illness: Antonio Moreno is a 86 y.o. male last assessed via telehealth encounter in April.  He presents for a routine visit.  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  He spends most of his time near [name].  He does report intermittent palpitations, this has not increased however.  He denies any sudden dizziness or syncope.  I had him come in for an ECG after our last telehealth encounter and he was in sinus rhythm at that time.  We went over his medications, he continues on Inderal LA and Coumadin which is followed by Dr. Woody Moreno.  He has had the coronavirus vaccine.  Past Medical History:  Diagnosis Date  . Atypical chest pain    Cardiolite 2009 without ischemia   . Colon polyps   . Internal hemorrhoids   . Mild carotid artery disease (HCC)    1-39% stenosis bilaterally  . PAF (paroxysmal atrial fibrillation) (HCC)    On Coumadin  . Squamous cell carcinoma of skin 05/11/2009   Right sideburn - tx p bx    Past Surgical History:  Procedure Laterality Date  . Abcess of Right Thigh    . CHOLECYSTECTOMY    . COLONOSCOPY N/A 11/18/2013   Procedure: COLONOSCOPY;  Surgeon: Antonio Houston, MD;  [facility];  Service: Endoscopy;  Laterality: N/A;  730  . LUMBAR LAMINECTOMY      Current Outpatient Medications  Medication Sig Dispense Refill  . clonazePAM (KLONOPIN) 0.5 MG tablet Take 0.5 mg by mouth as needed for anxiety.    Marland Kitchen doxepin (SINEQUAN) 25 MG capsule Take 12.5 mg by mouth at bedtime.     . dutasteride (AVODART) 0.5 MG capsule Take 0.5 mg by mouth daily.      . flurazepam (DALMANE) 15 MG capsule Take 15 mg by mouth at  bedtime as needed.    Marland Kitchen ketoconazole (NIZORAL) 2 % cream Apply 2 application topically 2 (two) times daily as needed.     Marland Kitchen lisinopril (PRINIVIL,ZESTRIL) 10 MG tablet Take 10 mg by mouth daily.    . propranolol (INDERAL LA) 60 MG 24 hr capsule Take 60 mg by mouth daily.      . silodosin (RAPAFLO) 8 MG CAPS capsule Take 1 capsule (8 mg total) by mouth daily. 30 capsule 2  . warfarin (COUMADIN) 5 MG tablet Take 5 mg by mouth as directed. MANAGED BY PMD     No current facility-administered medications for this visit.   Allergies:  Penicillins   ROS:   No syncope, no spontaneous bleeding problems.  Physical Exam: VS:  BP 124/70   Pulse 60   Ht 5' 11.75" (1.822 m)   Wt 198 lb (89.8 kg)   SpO2 95%   BMI 27.04 kg/m , BMI Body mass index is 27.04 kg/m.  Wt Readings from Last 3 Encounters:  06/27/19 198 lb (89.8 kg)  05/22/19 192 lb (87.1 kg)  05/20/19 192 lb (87.1 kg)    General: Elderly male, appears comfortable at rest. HEENT: Conjunctiva and lids normal, wearing a mask. Neck: Supple, no elevated JVP or carotid bruits, no thyromegaly. Lungs: Clear to auscultation,  nonlabored breathing at rest. Cardiac: Regular rate and rhythm, no S3, soft systolic murmur. Extremities: No pitting edema, distal pulses 2+.  ECG:  An ECG dated 05/23/2019 was personally reviewed today and demonstrated:  Sinus bradycardia with increased voltage and repolarization changes.  Recent Labwork:  No interval lab work for review today.  Other Studies Reviewed Today:  No interval cardiac testing for review today.  Assessment and Plan:  Paroxysmal atrial fibrillation with CHA2DS2-VASc score of 3-4.  He has done remarkably well over the years, does describe intermittent palpitations but no progressive symptoms.  Recent ECG showed sinus bradycardia.  He does not report any sudden dizziness or syncope.  Continue Inderal LA.  He remains on Coumadin with follow-up by his PCP Dr. Woody Moreno.  Medication  Adjustments/Labs and Tests Ordered: Current medicines are reviewed at length with the patient today.  Concerns regarding medicines are outlined above.   Tests Ordered: No orders of the defined types were placed in this encounter.   Medication Changes: No orders of the defined types were placed in this encounter.   Disposition:  Follow up 1 year in the [place] office.  Signed, Antonio Sark, MD, [facility] 06/27/2019 8:56 AM    [place] [facility] [place]. [address] [place], [place] [address] Phone: [phone]; Fax: [phone]

## 2019-06-27 ENCOUNTER — Other Ambulatory Visit: Payer: Self-pay

## 2019-06-27 ENCOUNTER — Ambulatory Visit (INDEPENDENT_AMBULATORY_CARE_PROVIDER_SITE_OTHER): Payer: Medicare Other | Admitting: Cardiology

## 2019-06-27 ENCOUNTER — Encounter: Payer: Self-pay | Admitting: Cardiology

## 2019-06-27 VITALS — BP 124/70 | HR 60 | Ht 71.75 in | Wt 198.0 lb

## 2019-06-27 DIAGNOSIS — I480 Paroxysmal atrial fibrillation: Secondary | ICD-10-CM

## 2019-06-27 NOTE — Patient Instructions (Signed)
Medication Instructions:  Your physician recommends that you continue on your current medications as directed. Please refer to the Current Medication list given to you today.  *If you need a refill on your cardiac medications before your next appointment, please call your pharmacy*   Lab Work: None today If you have labs (blood work) drawn today and your tests are completely normal, you will receive your results only by: . MyChart Message (if you have MyChart) OR . A paper copy in the mail If you have any lab test that is abnormal or we need to change your treatment, we will call you to review the results.   Testing/Procedures: None today   Follow-Up: At [facility], you and your health needs are our priority.  As part of our continuing mission to provide you with exceptional heart care, we have created designated Provider Care Teams.  These Care Teams include your primary Cardiologist (physician) and Advanced Practice Providers (APPs -  Physician Assistants and Nurse Practitioners) who all work together to provide you with the care you need, when you need it.  We recommend signing up for the patient portal called "MyChart".  Sign up information is provided on this After Visit Summary.  MyChart is used to connect with patients for Virtual Visits (Telemedicine).  Patients are able to view lab/test results, encounter notes, upcoming appointments, etc.  Non-urgent messages can be sent to your provider as well.   To learn more about what you can do with MyChart, go to [email or web address].    Your next appointment:   12 month(s)  The format for your next appointment:   In Person  Provider:   Samuel McDowell, MD   Other Instructions None       Thank you for choosing [facility] !         

## 2019-07-01 ENCOUNTER — Ambulatory Visit: Payer: Medicare Other | Admitting: Urology

## 2019-08-26 DIAGNOSIS — I482 Chronic atrial fibrillation, unspecified: Secondary | ICD-10-CM | POA: Diagnosis not present

## 2019-08-26 DIAGNOSIS — I10 Essential (primary) hypertension: Secondary | ICD-10-CM | POA: Diagnosis not present

## 2019-08-26 DIAGNOSIS — R350 Frequency of micturition: Secondary | ICD-10-CM | POA: Diagnosis not present

## 2019-08-26 DIAGNOSIS — G479 Sleep disorder, unspecified: Secondary | ICD-10-CM | POA: Diagnosis not present

## 2019-09-03 DIAGNOSIS — Z299 Encounter for prophylactic measures, unspecified: Secondary | ICD-10-CM | POA: Diagnosis not present

## 2019-09-03 DIAGNOSIS — I10 Essential (primary) hypertension: Secondary | ICD-10-CM | POA: Diagnosis not present

## 2019-09-03 DIAGNOSIS — I4891 Unspecified atrial fibrillation: Secondary | ICD-10-CM | POA: Diagnosis not present

## 2019-09-23 DIAGNOSIS — N401 Enlarged prostate with lower urinary tract symptoms: Secondary | ICD-10-CM | POA: Diagnosis not present

## 2019-09-23 DIAGNOSIS — R351 Nocturia: Secondary | ICD-10-CM | POA: Diagnosis not present

## 2019-09-23 DIAGNOSIS — G479 Sleep disorder, unspecified: Secondary | ICD-10-CM | POA: Diagnosis not present

## 2019-09-23 DIAGNOSIS — I10 Essential (primary) hypertension: Secondary | ICD-10-CM | POA: Diagnosis not present

## 2019-10-08 DIAGNOSIS — Z299 Encounter for prophylactic measures, unspecified: Secondary | ICD-10-CM | POA: Diagnosis not present

## 2019-10-08 DIAGNOSIS — G4700 Insomnia, unspecified: Secondary | ICD-10-CM | POA: Diagnosis not present

## 2019-10-08 DIAGNOSIS — R350 Frequency of micturition: Secondary | ICD-10-CM | POA: Diagnosis not present

## 2019-10-08 DIAGNOSIS — E1165 Type 2 diabetes mellitus with hyperglycemia: Secondary | ICD-10-CM | POA: Diagnosis not present

## 2019-10-08 DIAGNOSIS — I4891 Unspecified atrial fibrillation: Secondary | ICD-10-CM | POA: Diagnosis not present

## 2019-10-08 DIAGNOSIS — I10 Essential (primary) hypertension: Secondary | ICD-10-CM | POA: Diagnosis not present

## 2019-11-03 DIAGNOSIS — Z23 Encounter for immunization: Secondary | ICD-10-CM | POA: Diagnosis not present

## 2019-11-04 DIAGNOSIS — H6123 Impacted cerumen, bilateral: Secondary | ICD-10-CM | POA: Diagnosis not present

## 2019-11-04 DIAGNOSIS — H9193 Unspecified hearing loss, bilateral: Secondary | ICD-10-CM | POA: Diagnosis not present

## 2019-11-19 DIAGNOSIS — I10 Essential (primary) hypertension: Secondary | ICD-10-CM | POA: Diagnosis not present

## 2019-11-19 DIAGNOSIS — Z299 Encounter for prophylactic measures, unspecified: Secondary | ICD-10-CM | POA: Diagnosis not present

## 2019-11-19 DIAGNOSIS — Z7189 Other specified counseling: Secondary | ICD-10-CM | POA: Diagnosis not present

## 2019-11-19 DIAGNOSIS — R5383 Other fatigue: Secondary | ICD-10-CM | POA: Diagnosis not present

## 2019-11-19 DIAGNOSIS — Z6827 Body mass index (BMI) 27.0-27.9, adult: Secondary | ICD-10-CM | POA: Diagnosis not present

## 2019-11-19 DIAGNOSIS — R413 Other amnesia: Secondary | ICD-10-CM | POA: Diagnosis not present

## 2019-11-19 DIAGNOSIS — Z1339 Encounter for screening examination for other mental health and behavioral disorders: Secondary | ICD-10-CM | POA: Diagnosis not present

## 2019-11-19 DIAGNOSIS — R600 Localized edema: Secondary | ICD-10-CM | POA: Diagnosis not present

## 2019-11-19 DIAGNOSIS — M159 Polyosteoarthritis, unspecified: Secondary | ICD-10-CM | POA: Diagnosis not present

## 2019-11-19 DIAGNOSIS — I4891 Unspecified atrial fibrillation: Secondary | ICD-10-CM | POA: Diagnosis not present

## 2019-11-19 DIAGNOSIS — Z1331 Encounter for screening for depression: Secondary | ICD-10-CM | POA: Diagnosis not present

## 2019-11-19 DIAGNOSIS — Z125 Encounter for screening for malignant neoplasm of prostate: Secondary | ICD-10-CM | POA: Diagnosis not present

## 2019-11-19 DIAGNOSIS — Z0000 Encounter for general adult medical examination without abnormal findings: Secondary | ICD-10-CM | POA: Diagnosis not present

## 2019-11-19 DIAGNOSIS — Z79899 Other long term (current) drug therapy: Secondary | ICD-10-CM | POA: Diagnosis not present

## 2019-11-19 DIAGNOSIS — D6869 Other thrombophilia: Secondary | ICD-10-CM | POA: Diagnosis not present

## 2019-11-20 DIAGNOSIS — Z23 Encounter for immunization: Secondary | ICD-10-CM | POA: Diagnosis not present

## 2019-12-05 DIAGNOSIS — I2510 Atherosclerotic heart disease of native coronary artery without angina pectoris: Secondary | ICD-10-CM | POA: Diagnosis not present

## 2019-12-05 DIAGNOSIS — I4891 Unspecified atrial fibrillation: Secondary | ICD-10-CM | POA: Diagnosis not present

## 2019-12-25 DIAGNOSIS — I4891 Unspecified atrial fibrillation: Secondary | ICD-10-CM | POA: Diagnosis not present

## 2019-12-25 DIAGNOSIS — I10 Essential (primary) hypertension: Secondary | ICD-10-CM | POA: Diagnosis not present

## 2019-12-25 DIAGNOSIS — Z299 Encounter for prophylactic measures, unspecified: Secondary | ICD-10-CM | POA: Diagnosis not present

## 2020-01-21 DIAGNOSIS — I10 Essential (primary) hypertension: Secondary | ICD-10-CM | POA: Diagnosis not present

## 2020-01-21 DIAGNOSIS — E1165 Type 2 diabetes mellitus with hyperglycemia: Secondary | ICD-10-CM | POA: Diagnosis not present

## 2020-01-21 DIAGNOSIS — I4891 Unspecified atrial fibrillation: Secondary | ICD-10-CM | POA: Diagnosis not present

## 2020-01-21 DIAGNOSIS — Z299 Encounter for prophylactic measures, unspecified: Secondary | ICD-10-CM | POA: Diagnosis not present

## 2020-01-21 DIAGNOSIS — G4700 Insomnia, unspecified: Secondary | ICD-10-CM | POA: Diagnosis not present

## 2020-03-08 DIAGNOSIS — J45909 Unspecified asthma, uncomplicated: Secondary | ICD-10-CM | POA: Diagnosis not present

## 2020-03-08 DIAGNOSIS — G4700 Insomnia, unspecified: Secondary | ICD-10-CM | POA: Diagnosis not present

## 2020-04-08 DIAGNOSIS — Z961 Presence of intraocular lens: Secondary | ICD-10-CM | POA: Diagnosis not present

## 2020-04-08 DIAGNOSIS — H53012 Deprivation amblyopia, left eye: Secondary | ICD-10-CM | POA: Diagnosis not present

## 2020-04-08 DIAGNOSIS — H524 Presbyopia: Secondary | ICD-10-CM | POA: Diagnosis not present

## 2020-04-08 DIAGNOSIS — H353131 Nonexudative age-related macular degeneration, bilateral, early dry stage: Secondary | ICD-10-CM | POA: Diagnosis not present

## 2020-04-20 DIAGNOSIS — Z299 Encounter for prophylactic measures, unspecified: Secondary | ICD-10-CM | POA: Diagnosis not present

## 2020-04-20 DIAGNOSIS — R350 Frequency of micturition: Secondary | ICD-10-CM | POA: Diagnosis not present

## 2020-04-20 DIAGNOSIS — E1165 Type 2 diabetes mellitus with hyperglycemia: Secondary | ICD-10-CM | POA: Diagnosis not present

## 2020-04-20 DIAGNOSIS — I4891 Unspecified atrial fibrillation: Secondary | ICD-10-CM | POA: Diagnosis not present

## 2020-04-20 DIAGNOSIS — I10 Essential (primary) hypertension: Secondary | ICD-10-CM | POA: Diagnosis not present

## 2020-05-03 DIAGNOSIS — M6281 Muscle weakness (generalized): Secondary | ICD-10-CM | POA: Diagnosis not present

## 2020-05-03 DIAGNOSIS — R262 Difficulty in walking, not elsewhere classified: Secondary | ICD-10-CM | POA: Diagnosis not present

## 2020-05-05 DIAGNOSIS — M6281 Muscle weakness (generalized): Secondary | ICD-10-CM | POA: Diagnosis not present

## 2020-05-05 DIAGNOSIS — R262 Difficulty in walking, not elsewhere classified: Secondary | ICD-10-CM | POA: Diagnosis not present

## 2020-05-10 DIAGNOSIS — R262 Difficulty in walking, not elsewhere classified: Secondary | ICD-10-CM | POA: Diagnosis not present

## 2020-05-10 DIAGNOSIS — M6281 Muscle weakness (generalized): Secondary | ICD-10-CM | POA: Diagnosis not present

## 2020-05-11 ENCOUNTER — Ambulatory Visit: Payer: Medicare Other | Admitting: Urology

## 2020-05-12 DIAGNOSIS — R262 Difficulty in walking, not elsewhere classified: Secondary | ICD-10-CM | POA: Diagnosis not present

## 2020-05-12 DIAGNOSIS — M6281 Muscle weakness (generalized): Secondary | ICD-10-CM | POA: Diagnosis not present

## 2020-05-17 DIAGNOSIS — M6281 Muscle weakness (generalized): Secondary | ICD-10-CM | POA: Diagnosis not present

## 2020-05-17 DIAGNOSIS — R262 Difficulty in walking, not elsewhere classified: Secondary | ICD-10-CM | POA: Diagnosis not present

## 2020-05-18 ENCOUNTER — Ambulatory Visit: Payer: Medicare Other | Admitting: Urology

## 2020-05-19 DIAGNOSIS — M6281 Muscle weakness (generalized): Secondary | ICD-10-CM | POA: Diagnosis not present

## 2020-05-19 DIAGNOSIS — R262 Difficulty in walking, not elsewhere classified: Secondary | ICD-10-CM | POA: Diagnosis not present

## 2020-05-24 DIAGNOSIS — Z23 Encounter for immunization: Secondary | ICD-10-CM | POA: Diagnosis not present

## 2020-05-25 ENCOUNTER — Ambulatory Visit: Payer: Medicare Other | Admitting: Urology

## 2020-05-31 DIAGNOSIS — M6281 Muscle weakness (generalized): Secondary | ICD-10-CM | POA: Diagnosis not present

## 2020-05-31 DIAGNOSIS — R262 Difficulty in walking, not elsewhere classified: Secondary | ICD-10-CM | POA: Diagnosis not present

## 2020-06-02 DIAGNOSIS — M6281 Muscle weakness (generalized): Secondary | ICD-10-CM | POA: Diagnosis not present

## 2020-06-02 DIAGNOSIS — R262 Difficulty in walking, not elsewhere classified: Secondary | ICD-10-CM | POA: Diagnosis not present

## 2020-06-05 DIAGNOSIS — E1165 Type 2 diabetes mellitus with hyperglycemia: Secondary | ICD-10-CM | POA: Diagnosis not present

## 2020-06-05 DIAGNOSIS — I10 Essential (primary) hypertension: Secondary | ICD-10-CM | POA: Diagnosis not present

## 2020-06-08 DIAGNOSIS — M6281 Muscle weakness (generalized): Secondary | ICD-10-CM | POA: Diagnosis not present

## 2020-06-08 DIAGNOSIS — R262 Difficulty in walking, not elsewhere classified: Secondary | ICD-10-CM | POA: Diagnosis not present

## 2020-06-10 DIAGNOSIS — M6281 Muscle weakness (generalized): Secondary | ICD-10-CM | POA: Diagnosis not present

## 2020-06-10 DIAGNOSIS — R262 Difficulty in walking, not elsewhere classified: Secondary | ICD-10-CM | POA: Diagnosis not present

## 2020-06-23 ENCOUNTER — Ambulatory Visit (INDEPENDENT_AMBULATORY_CARE_PROVIDER_SITE_OTHER): Payer: Medicare Other | Admitting: Dermatology

## 2020-06-23 ENCOUNTER — Encounter: Payer: Self-pay | Admitting: Dermatology

## 2020-06-23 ENCOUNTER — Other Ambulatory Visit: Payer: Self-pay

## 2020-06-23 DIAGNOSIS — L821 Other seborrheic keratosis: Secondary | ICD-10-CM | POA: Diagnosis not present

## 2020-06-23 DIAGNOSIS — Z1283 Encounter for screening for malignant neoplasm of skin: Secondary | ICD-10-CM | POA: Diagnosis not present

## 2020-06-23 DIAGNOSIS — G4700 Insomnia, unspecified: Secondary | ICD-10-CM | POA: Diagnosis not present

## 2020-06-23 DIAGNOSIS — N481 Balanitis: Secondary | ICD-10-CM | POA: Diagnosis not present

## 2020-06-23 DIAGNOSIS — I10 Essential (primary) hypertension: Secondary | ICD-10-CM | POA: Diagnosis not present

## 2020-06-23 DIAGNOSIS — Z299 Encounter for prophylactic measures, unspecified: Secondary | ICD-10-CM | POA: Diagnosis not present

## 2020-06-23 DIAGNOSIS — I4891 Unspecified atrial fibrillation: Secondary | ICD-10-CM | POA: Diagnosis not present

## 2020-06-24 ENCOUNTER — Other Ambulatory Visit (HOSPITAL_COMMUNITY)
Admission: RE | Admit: 2020-06-24 | Discharge: 2020-06-24 | Disposition: A | Discharge status: Home / Self Care | Payer: Medicare Other | Source: Ambulatory Visit | Attending: Physician Assistant | Admitting: Physician Assistant

## 2020-06-24 ENCOUNTER — Encounter: Payer: Self-pay | Admitting: Physician Assistant

## 2020-06-24 ENCOUNTER — Ambulatory Visit (INDEPENDENT_AMBULATORY_CARE_PROVIDER_SITE_OTHER): Payer: Medicare Other | Admitting: Physician Assistant

## 2020-06-24 VITALS — BP 150/80 | HR 61 | Ht 72.0 in | Wt 200.4 lb

## 2020-06-24 DIAGNOSIS — I480 Paroxysmal atrial fibrillation: Secondary | ICD-10-CM | POA: Diagnosis not present

## 2020-06-24 DIAGNOSIS — I10 Essential (primary) hypertension: Secondary | ICD-10-CM

## 2020-06-24 DIAGNOSIS — I440 Atrioventricular block, first degree: Secondary | ICD-10-CM

## 2020-06-24 DIAGNOSIS — I779 Disorder of arteries and arterioles, unspecified: Secondary | ICD-10-CM

## 2020-06-24 LAB — CBC WITH DIFFERENTIAL/PLATELET
Abs Immature Granulocytes: 0.05 10*3/uL (ref 0.00–0.07)
Basophils Absolute: 0.1 10*3/uL (ref 0.0–0.1)
Basophils Relative: 1 %
Eosinophils Absolute: 0.3 10*3/uL (ref 0.0–0.5)
Eosinophils Relative: 3 %
HCT: 40.5 % (ref 39.0–52.0)
Hemoglobin: 13.3 g/dL (ref 13.0–17.0)
Immature Granulocytes: 1 %
Lymphocytes Relative: 24 %
Lymphs Abs: 2.5 10*3/uL (ref 0.7–4.0)
MCH: 32.2 pg (ref 26.0–34.0)
MCHC: 32.8 g/dL (ref 30.0–36.0)
MCV: 98.1 fL (ref 80.0–100.0)
Monocytes Absolute: 0.8 10*3/uL (ref 0.1–1.0)
Monocytes Relative: 8 %
Neutro Abs: 6.3 10*3/uL (ref 1.7–7.7)
Neutrophils Relative %: 63 %
Platelets: 227 10*3/uL (ref 150–400)
RBC: 4.13 MIL/uL — ABNORMAL LOW (ref 4.22–5.81)
RDW: 13.9 % (ref 11.5–15.5)
WBC: 10.1 10*3/uL (ref 4.0–10.5)
nRBC: 0 % (ref 0.0–0.2)

## 2020-06-24 LAB — BASIC METABOLIC PANEL
Anion gap: 9 (ref 5–15)
BUN: 17 mg/dL (ref 8–23)
CO2: 23 mmol/L (ref 22–32)
Calcium: 9 mg/dL (ref 8.9–10.3)
Chloride: 102 mmol/L (ref 98–111)
Creatinine, Ser: 0.81 mg/dL (ref 0.61–1.24)
GFR, Estimated: >60 mL/min (ref >60)
Glucose, Bld: 135 mg/dL — ABNORMAL HIGH (ref 70–99)
Potassium: 4.5 mmol/L (ref 3.5–5.1)
Sodium: 134 mmol/L — ABNORMAL LOW (ref 135–145)

## 2020-06-24 NOTE — Progress Notes (Signed)
Cardiology Office Note    Date:  06/24/2020   ID:  [name], DOB 07-26-1920, MRN [id number]  PCP:  Glenda Chroman, MD  Cardiologist:  Rozann Lesches, MD  Electrophysiologist:  None   Chief Complaint: f/u atrial fib  History of Present Illness:   Antonio Moreno is a 87 y.o. male with history of PAF, atypical chest pain (2009 NST without ischemia, EF 56%), mild carotid artery disease (1-39% BICA 2015), essential HTN, colon polyps, internal hemorrhoids who presents for follow-up. He has been followed by Dr. Domenic Polite and maintained on propranolol/Coumadin over the years. INR followed by PCP Dr. Woody Seller.  He presents for overdue follow-up today doing well. He appears younger than stated age. 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 He denies any CP, SOB, palpitations, dizziness, falls or signs of GI bleeding. He reports a prior history of blood in urine that was followed by urology in the past but no persistent issue recently.   Labwork independently reviewed: 2015 Hgb 14.2, Cr 0.86, K 4.9, LFTs wnl  Past Medical History:  Diagnosis Date  . Atypical chest pain    Cardiolite 2009 without ischemia   . Colon polyps   . First degree AV block   . HTN (hypertension)   . Internal hemorrhoids   . Mild carotid artery disease (HCC)    1-39% stenosis bilaterally  . PAF (paroxysmal atrial fibrillation) (HCC)    On Coumadin  . Squamous cell carcinoma of skin 05/11/2009   Right sideburn - tx p bx    Past Surgical History:  Procedure Laterality Date  . Abcess of Right Thigh    . CHOLECYSTECTOMY    . COLONOSCOPY N/A 11/18/2013   Procedure: COLONOSCOPY;  Surgeon: Rogene Houston, MD;  [facility];  Service: Endoscopy;  Laterality: N/A;  730  . LUMBAR LAMINECTOMY      Current Medications: Current Meds  Medication Sig  . ketoconazole (NIZORAL) 2 % cream Apply 2 application topically 2 (two) times daily as needed.   Marland Kitchen lisinopril  (PRINIVIL,ZESTRIL) 10 MG tablet Take 10 mg by mouth daily.  . propranolol (INDERAL LA) 60 MG 24 hr capsule Take 60 mg by mouth daily.    . silodosin (RAPAFLO) 8 MG CAPS capsule Take 1 capsule (8 mg total) by mouth daily.  Marland Kitchen warfarin (COUMADIN) 5 MG tablet Take 5 mg by mouth as directed. MANAGED BY PMD      Allergies:   Penicillins   Social History   Socioeconomic History  . Marital status: Widowed    Spouse name: Not on file  . Number of children: 1  . Years of education: Not on file  . Highest education level: Not on file  Occupational History  . Occupation: REAL Data processing manager    Comment: [employer]  Tobacco Use  . Smoking status: Former Smoker    Packs/day: 1.00    Years: 2.00    Pack years: 2.00    Types: Cigarettes    Start date: 10/26/1941    Quit date: 03/09/1944    Years since quitting: 76.3  . Smokeless tobacco: Never Used  Vaping Use  . Vaping Use: Never used  Substance and Sexual Activity  . Alcohol use: No    Alcohol/week: 0.0 standard drinks  . Drug use: No  . Sexual activity: Not on file  Other Topics Concern  . Not on file  Social History Narrative   Remains very active. 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   Social Determinants of Health   Financial Resource Strain: Not on file  Food Insecurity: Not on file  Transportation Needs: Not on file  Physical Activity: Not on file  Stress: Not on file  Social Connections: Not on file     Family History:  The patient's family history is negative for Colon cancer and Colon polyps.  ROS:   Please see the history of present illness.  All other systems are reviewed and otherwise negative.    EKGs/Labs/Other Studies Reviewed:    Studies reviewed are outlined and summarized above. Reports included below if pertinent.  NST 2009 - see scan Carotid duplex 2015 - see scan Cannot copy/paste here    EKG:  EKG is ordered today, personally  reviewed, demonstrating NSR 61bpm first degree AVB, nonspecific STTW changes similar to prior except 1AVB new  Recent Labs: No results found for requested labs within last 8760 hours.  Recent Lipid Panel No results found for: CHOL, TRIG, HDL, CHOLHDL, VLDL, LDLCALC, LDLDIRECT  PHYSICAL EXAM:    VS:  BP (!) 150/80   Pulse 61   Ht 6' (1.829 m)   Wt 200 lb 6.4 oz (90.9 kg)   SpO2 95%   BMI 27.18 kg/m   BMI: Body mass index is 27.18 kg/m.  GEN: Well nourished, well developed male in no acute distress HEENT: normocephalic, atraumatic Neck: no JVD, carotid bruits, or masses Cardiac: RRR; no murmurs, rubs, or gallops, no edema  Respiratory:  clear to auscultation bilaterally, normal work of breathing GI: soft, nontender, nondistended, + BS MS: no deformity or atrophy Skin: warm and dry, no rash Neuro:  Alert and Oriented x 3, Strength and sensation are intact, follows commands Psych: euthymic mood, full affect  Wt Readings from Last 3 Encounters:  06/24/20 200 lb 6.4 oz (90.9 kg)  06/27/19 198 lb (89.8 kg)  05/22/19 192 lb (87.1 kg)     ASSESSMENT & PLAN:   1. PAF - remains in NSR at this time. He has been maintained on a regimen of propranolol and warfarin. Since he is doing well on this and remains clinically stable, I am not inclined to make any changes today. He states his INR is followed by Dr. Woody Seller and that he last got his INR checked yesterday. He states they have him come in every 30 days for this. He reports last physical labs were around 10/2019. I do not have these records and no KPN available at the moment. Given his advanced age, would be prudent to have a CBC at least every 6 months given warfarin. We will update CBC/BMET today.  2. Mild carotid artery disease - no significant carotid bruit on exam. Asymptomatic. Will defer further screening given advanced age.  3. Essential HTN - suboptimal BP noted today, but would not be overly aggressive give his age. He reports  occasionally higher at home therefore I have asked him to monitor this for the next few days at least 3hr after taking his medicine and to call our office with these readings.  4. First degree AVB - noted on EKG. No bradycardia seen. Follow conservatively at this time and monitor for bradycardia going forward.  Disposition: F/u with Dr. Domenic Polite in 6 months.   Medication Adjustments/Labs and Tests Ordered: Current medicines are reviewed at length with the patient today.  Concerns regarding medicines are outlined above. Medication changes, Labs  and Tests ordered today are summarized above and listed in the Patient Instructions accessible in Encounters.    Signed, Charlie Pitter, PA-C  06/24/2020 2:11 PM    [facility] in [place], [place] [address] Ph: [phone]; Fax [phone]

## 2020-06-24 NOTE — Patient Instructions (Signed)
Medication Instructions:  Your physician recommends that you continue on your current medications as directed. Please refer to the Current Medication list given to you today.  *If you need a refill on your cardiac medications before your next appointment, please call your pharmacy*   Lab Work: Your physician recommends that you return for lab work in: Today   If you have labs (blood work) drawn today and your tests are completely normal, you will receive your results only by: Marland Kitchen MyChart Message (if you have MyChart) OR . A paper copy in the mail If you have any lab test that is abnormal or we need to change your treatment, we will call you to review the results.   Testing/Procedures: NONE    Follow-Up: At [facility], you and your health needs are our priority.  As part of our continuing mission to provide you with exceptional heart care, we have created designated Provider Care Teams.  These Care Teams include your primary Cardiologist (physician) and Advanced Practice Providers (APPs -  Physician Assistants and Nurse Practitioners) who all work together to provide you with the care you need, when you need it.  We recommend signing up for the patient portal called "MyChart".  Sign up information is provided on this After Visit Summary.  MyChart is used to connect with patients for Virtual Visits (Telemedicine).  Patients are able to view lab/test results, encounter notes, upcoming appointments, etc.  Non-urgent messages can be sent to your provider as well.   To learn more about what you can do with MyChart, go to [email or web address].    Your next appointment:   6 month(s)  The format for your next appointment:   In Person  Provider:   Rozann Lesches, MD   Other Instructions Please check Blood Pressure at least 3 hours after medication for next several days and call with results.

## 2020-06-25 ENCOUNTER — Telehealth: Payer: Self-pay

## 2020-06-25 NOTE — Telephone Encounter (Signed)
Pt verbalized understanding of results. Pt had no concerns or questions at this time.

## 2020-06-25 NOTE — Addendum Note (Signed)
Addended by: Berlinda Last on: 06/25/2020 08:27 AM   Modules accepted: Orders

## 2020-06-25 NOTE — Telephone Encounter (Signed)
-----   Message from Charlie Pitter, [place] sent at 06/24/2020  4:47 PM EDT ----- Please let pt know labs stable. Blood sugar mildly elevated - can continue routine f/u pcp for this. Otherwise no concerns.

## 2020-07-07 ENCOUNTER — Encounter: Payer: Self-pay | Admitting: Dermatology

## 2020-07-07 NOTE — Progress Notes (Signed)
   Follow-Up Visit   Subjective  Antonio Moreno is a 87 y.o. male who presents for the following: Annual Exam.  General skin examination Location:  Duration:  Quality:  Associated Signs/Symptoms: Modifying Factors:  Severity:  Timing: Context:   Objective  Well appearing patient in no apparent distress; mood and affect are within normal limits. Objective  Left Abdomen (side) - Lower: Waist up skin examination, no atypical pigmented lesions or nonmelanoma skin cancer.  Objective  Right Breast: 6 mm brown textured flattopped papule    All skin waist up examined.   Assessment & Plan    Encounter for screening for malignant neoplasm of skin Left Abdomen (side) - Lower  I am delighted to see Antonio Moreno annually or on an as-needed basis.  Seborrheic keratosis Right Breast  May leave if stable      I, Lavonna Monarch, MD, have reviewed all documentation for this visit.  The documentation on 07/07/20 for the exam, diagnosis, procedures, and orders are all accurate and complete.

## 2020-08-18 DIAGNOSIS — I4891 Unspecified atrial fibrillation: Secondary | ICD-10-CM | POA: Diagnosis not present

## 2020-08-18 DIAGNOSIS — D6869 Other thrombophilia: Secondary | ICD-10-CM | POA: Diagnosis not present

## 2020-08-18 DIAGNOSIS — Z299 Encounter for prophylactic measures, unspecified: Secondary | ICD-10-CM | POA: Diagnosis not present

## 2020-08-18 DIAGNOSIS — E1129 Type 2 diabetes mellitus with other diabetic kidney complication: Secondary | ICD-10-CM | POA: Diagnosis not present

## 2020-08-18 DIAGNOSIS — E1165 Type 2 diabetes mellitus with hyperglycemia: Secondary | ICD-10-CM | POA: Diagnosis not present

## 2020-09-06 DIAGNOSIS — H6123 Impacted cerumen, bilateral: Secondary | ICD-10-CM | POA: Diagnosis not present

## 2020-09-06 DIAGNOSIS — H9193 Unspecified hearing loss, bilateral: Secondary | ICD-10-CM | POA: Diagnosis not present

## 2020-09-07 DIAGNOSIS — M25512 Pain in left shoulder: Secondary | ICD-10-CM | POA: Diagnosis not present

## 2020-09-07 DIAGNOSIS — M25511 Pain in right shoulder: Secondary | ICD-10-CM | POA: Diagnosis not present

## 2020-09-08 DIAGNOSIS — M25511 Pain in right shoulder: Secondary | ICD-10-CM | POA: Diagnosis not present

## 2020-09-08 DIAGNOSIS — M25512 Pain in left shoulder: Secondary | ICD-10-CM | POA: Diagnosis not present

## 2020-09-15 DIAGNOSIS — M25512 Pain in left shoulder: Secondary | ICD-10-CM | POA: Diagnosis not present

## 2020-09-15 DIAGNOSIS — M25511 Pain in right shoulder: Secondary | ICD-10-CM | POA: Diagnosis not present

## 2020-09-22 DIAGNOSIS — Z299 Encounter for prophylactic measures, unspecified: Secondary | ICD-10-CM | POA: Diagnosis not present

## 2020-09-22 DIAGNOSIS — B359 Dermatophytosis, unspecified: Secondary | ICD-10-CM | POA: Diagnosis not present

## 2020-09-22 DIAGNOSIS — I10 Essential (primary) hypertension: Secondary | ICD-10-CM | POA: Diagnosis not present

## 2020-09-22 DIAGNOSIS — N400 Enlarged prostate without lower urinary tract symptoms: Secondary | ICD-10-CM | POA: Diagnosis not present

## 2020-09-22 DIAGNOSIS — I4891 Unspecified atrial fibrillation: Secondary | ICD-10-CM | POA: Diagnosis not present

## 2020-10-06 DIAGNOSIS — I10 Essential (primary) hypertension: Secondary | ICD-10-CM | POA: Diagnosis not present

## 2020-10-06 DIAGNOSIS — E1165 Type 2 diabetes mellitus with hyperglycemia: Secondary | ICD-10-CM | POA: Diagnosis not present

## 2020-10-15 DIAGNOSIS — Z23 Encounter for immunization: Secondary | ICD-10-CM | POA: Diagnosis not present

## 2020-11-12 DIAGNOSIS — Z20822 Contact with and (suspected) exposure to covid-19: Secondary | ICD-10-CM | POA: Diagnosis not present

## 2020-11-15 DIAGNOSIS — D6869 Other thrombophilia: Secondary | ICD-10-CM | POA: Diagnosis not present

## 2020-11-15 DIAGNOSIS — I10 Essential (primary) hypertension: Secondary | ICD-10-CM | POA: Diagnosis not present

## 2020-11-15 DIAGNOSIS — Z23 Encounter for immunization: Secondary | ICD-10-CM | POA: Diagnosis not present

## 2020-11-15 DIAGNOSIS — I4891 Unspecified atrial fibrillation: Secondary | ICD-10-CM | POA: Diagnosis not present

## 2020-11-15 DIAGNOSIS — E1165 Type 2 diabetes mellitus with hyperglycemia: Secondary | ICD-10-CM | POA: Diagnosis not present

## 2020-11-15 DIAGNOSIS — Z299 Encounter for prophylactic measures, unspecified: Secondary | ICD-10-CM | POA: Diagnosis not present

## 2020-11-16 ENCOUNTER — Encounter: Payer: Self-pay | Admitting: Cardiology

## 2020-11-16 ENCOUNTER — Ambulatory Visit (INDEPENDENT_AMBULATORY_CARE_PROVIDER_SITE_OTHER): Payer: Medicare Other | Admitting: Cardiology

## 2020-11-16 VITALS — BP 138/80 | HR 70 | Ht 71.0 in | Wt 197.2 lb

## 2020-11-16 DIAGNOSIS — R600 Localized edema: Secondary | ICD-10-CM

## 2020-11-16 DIAGNOSIS — Z79899 Other long term (current) drug therapy: Secondary | ICD-10-CM | POA: Diagnosis not present

## 2020-11-16 DIAGNOSIS — I480 Paroxysmal atrial fibrillation: Secondary | ICD-10-CM | POA: Diagnosis not present

## 2020-11-16 DIAGNOSIS — Z125 Encounter for screening for malignant neoplasm of prostate: Secondary | ICD-10-CM | POA: Diagnosis not present

## 2020-11-16 DIAGNOSIS — R5383 Other fatigue: Secondary | ICD-10-CM | POA: Diagnosis not present

## 2020-11-16 MED ORDER — FUROSEMIDE 20 MG PO TABS
20.0000 mg | ORAL_TABLET | Freq: Every day | ORAL | 1 refills | Status: DC | PRN
Start: 2020-11-16 — End: 2020-12-28

## 2020-11-16 NOTE — Patient Instructions (Addendum)
Medication Instructions:  Your physician has recommended you make the following change in your medication:  Start furosemide 20 mg daily as needed for swelling. Not to be used daily Continue other medications the same  Labwork: none  Testing/Procedures: none  Follow-Up: Your physician recommends that you schedule a follow-up appointment in: 6 months  Any Other Special Instructions Will Be Listed Below (If Applicable).  If you need a refill on your cardiac medications before your next appointment, please call your pharmacy.

## 2020-11-16 NOTE — Progress Notes (Signed)
Cardiology Office Note  Date: 11/16/2020   ID: Antonio Moreno, DOB 08-14-1920, MRN [id number]  PCP:  Glenda Chroman, MD  Cardiologist:  Rozann Lesches, MD Electrophysiologist:  None   Chief Complaint  Patient presents with   Cardiac follow-up    History of Present Illness: Antonio Moreno is a 87 y.o. male last seen in May by Ms. Dunn PA-C.  He is here for a routine visit.  He does not report feeling any sense of palpitations or chest pain.  States that he has noticed leg swelling over the last year, intermittent, usually better when he gets up in the morning.  Sometimes feels tight, but generally has not been overly bothersome.  He does not describe any associated orthopnea or PND.  He remains on Coumadin with follow-up by Dr. Woody Seller.  He does not report any bleeding problems.  He is also on Inderal LA.  Heart rate is regular today.  Past Medical History:  Diagnosis Date   Atypical chest pain    Cardiolite 2009 without ischemia    Colon polyps    First degree AV block    HTN (hypertension)    Internal hemorrhoids    Mild carotid artery disease (HCC)    1-39% stenosis bilaterally   PAF (paroxysmal atrial fibrillation) (HCC)    On Coumadin   Squamous cell carcinoma of skin 05/11/2009   Right sideburn - tx p bx    Past Surgical History:  Procedure Laterality Date   Abcess of Right Thigh     CHOLECYSTECTOMY     COLONOSCOPY N/A 11/18/2013   Procedure: COLONOSCOPY;  Surgeon: Rogene Houston, MD;  [facility];  Service: Endoscopy;  Laterality: N/A;  730   LUMBAR LAMINECTOMY      Current Outpatient Medications  Medication Sig Dispense Refill   dutasteride (AVODART) 0.5 MG capsule Take 0.5 mg by mouth daily.       flurazepam (DALMANE) 15 MG capsule Take 15 mg by mouth at bedtime as needed.     furosemide (LASIX) 20 MG tablet Take 1 tablet (20 mg total) by mouth daily as needed (swelling). 15 tablet 1   lisinopril (PRINIVIL,ZESTRIL) 10 MG tablet Take 10 mg by  mouth daily.     propranolol (INDERAL LA) 60 MG 24 hr capsule Take 60 mg by mouth daily.       silodosin (RAPAFLO) 8 MG CAPS capsule Take 1 capsule (8 mg total) by mouth daily. 30 capsule 2   warfarin (COUMADIN) 5 MG tablet Take 5 mg by mouth as directed. MANAGED BY PMD     No current facility-administered medications for this visit.   Allergies:  Penicillins   ROS: No syncope.  Physical Exam: VS:  BP 138/80   Pulse 70   Ht 5\' 11"  (1.803 m)   Wt 197 lb 3.2 oz (89.4 kg)   SpO2 97%   BMI 27.50 kg/m , BMI Body mass index is 27.5 kg/m.  Wt Readings from Last 3 Encounters:  11/16/20 197 lb 3.2 oz (89.4 kg)  06/24/20 200 lb 6.4 oz (90.9 kg)  06/27/19 198 lb (89.8 kg)    General: Patient appears comfortable at rest. HEENT: Conjunctiva and lids normal, wearing a mask. Neck: Supple, no elevated JVP or carotid bruits, no thyromegaly. Lungs: Clear to auscultation, nonlabored breathing at rest. Cardiac: Regular rate and rhythm, no S3, 1/6 systolic murmur. Extremities: 2+ lower leg edema.  ECG:  An ECG dated 06/24/2020 was personally reviewed today  and demonstrated:  Sinus rhythm with prolonged PR interval and increased voltage.  Recent Labwork: 06/24/2020: BUN 17; Creatinine, Ser 0.81; Hemoglobin 13.3; Platelets 227; Potassium 4.5; Sodium 134   Other Studies Reviewed Today:  No interval cardiac testing for review today.  Assessment and Plan:  1.  Paroxysmal atrial fibrillation with CHA2DS2-VASc score of 3-4.  Plan to continue Inderal LA, he does not describe any active palpitations.  Heart rate is regular today.  He is also on Coumadin with follow-up by Dr. Woody Seller.  2.  Fluctuating leg edema.  We did talk about whether further testing would be pursued versus symptomatic treatment with low-dose Lasix as needed.  For now he was most comfortable with intermittent use of diuretics.  Holding off on echocardiogram for now.  Lab work from May showed normal renal function and  potassium.  Medication Adjustments/Labs and Tests Ordered: Current medicines are reviewed at length with the patient today.  Concerns regarding medicines are outlined above.   Tests Ordered: No orders of the defined types were placed in this encounter.   Medication Changes: Meds ordered this encounter  Medications   furosemide (LASIX) 20 MG tablet    Sig: Take 1 tablet (20 mg total) by mouth daily as needed (swelling).    Dispense:  15 tablet    Refill:  1    11/16/2020 NEW     Disposition:  Follow up  6 months.  Signed, Satira Sark, MD, [facility] 11/16/2020 4:47 PM    [facility] [place], [place], [place] [address] Phone: [phone]; Fax: [phone]

## 2020-11-17 DIAGNOSIS — Z6827 Body mass index (BMI) 27.0-27.9, adult: Secondary | ICD-10-CM | POA: Diagnosis not present

## 2020-11-17 DIAGNOSIS — Z1331 Encounter for screening for depression: Secondary | ICD-10-CM | POA: Diagnosis not present

## 2020-11-17 DIAGNOSIS — Z299 Encounter for prophylactic measures, unspecified: Secondary | ICD-10-CM | POA: Diagnosis not present

## 2020-11-17 DIAGNOSIS — Z789 Other specified health status: Secondary | ICD-10-CM | POA: Diagnosis not present

## 2020-11-17 DIAGNOSIS — Z7189 Other specified counseling: Secondary | ICD-10-CM | POA: Diagnosis not present

## 2020-11-17 DIAGNOSIS — Z1339 Encounter for screening examination for other mental health and behavioral disorders: Secondary | ICD-10-CM | POA: Diagnosis not present

## 2020-11-17 DIAGNOSIS — Z0000 Encounter for general adult medical examination without abnormal findings: Secondary | ICD-10-CM | POA: Diagnosis not present

## 2020-11-17 DIAGNOSIS — I10 Essential (primary) hypertension: Secondary | ICD-10-CM | POA: Diagnosis not present

## 2020-11-17 DIAGNOSIS — D696 Thrombocytopenia, unspecified: Secondary | ICD-10-CM | POA: Diagnosis not present

## 2020-11-26 DIAGNOSIS — J159 Unspecified bacterial pneumonia: Secondary | ICD-10-CM | POA: Diagnosis not present

## 2020-11-26 DIAGNOSIS — R058 Other specified cough: Secondary | ICD-10-CM | POA: Diagnosis not present

## 2020-12-23 DIAGNOSIS — Z20828 Contact with and (suspected) exposure to other viral communicable diseases: Secondary | ICD-10-CM | POA: Diagnosis not present

## 2020-12-28 ENCOUNTER — Other Ambulatory Visit: Payer: Self-pay | Admitting: Cardiology

## 2021-01-01 DIAGNOSIS — S81801A Unspecified open wound, right lower leg, initial encounter: Secondary | ICD-10-CM | POA: Diagnosis not present

## 2021-01-05 DIAGNOSIS — F419 Anxiety disorder, unspecified: Secondary | ICD-10-CM | POA: Diagnosis not present

## 2021-01-05 DIAGNOSIS — M1990 Unspecified osteoarthritis, unspecified site: Secondary | ICD-10-CM | POA: Diagnosis not present

## 2021-01-07 DIAGNOSIS — L97811 Non-pressure chronic ulcer of other part of right lower leg limited to breakdown of skin: Secondary | ICD-10-CM | POA: Diagnosis not present

## 2021-01-07 DIAGNOSIS — Z7901 Long term (current) use of anticoagulants: Secondary | ICD-10-CM | POA: Diagnosis not present

## 2021-01-12 DIAGNOSIS — N400 Enlarged prostate without lower urinary tract symptoms: Secondary | ICD-10-CM | POA: Diagnosis not present

## 2021-01-12 DIAGNOSIS — I4891 Unspecified atrial fibrillation: Secondary | ICD-10-CM | POA: Diagnosis not present

## 2021-01-12 DIAGNOSIS — I10 Essential (primary) hypertension: Secondary | ICD-10-CM | POA: Diagnosis not present

## 2021-01-12 DIAGNOSIS — E1165 Type 2 diabetes mellitus with hyperglycemia: Secondary | ICD-10-CM | POA: Diagnosis not present

## 2021-01-12 DIAGNOSIS — Z299 Encounter for prophylactic measures, unspecified: Secondary | ICD-10-CM | POA: Diagnosis not present

## 2021-01-17 DIAGNOSIS — L97811 Non-pressure chronic ulcer of other part of right lower leg limited to breakdown of skin: Secondary | ICD-10-CM | POA: Diagnosis not present

## 2021-01-20 DIAGNOSIS — L97812 Non-pressure chronic ulcer of other part of right lower leg with fat layer exposed: Secondary | ICD-10-CM | POA: Diagnosis not present

## 2021-01-24 DIAGNOSIS — L97812 Non-pressure chronic ulcer of other part of right lower leg with fat layer exposed: Secondary | ICD-10-CM | POA: Diagnosis not present

## 2021-02-08 DIAGNOSIS — M5126 Other intervertebral disc displacement, lumbar region: Secondary | ICD-10-CM | POA: Diagnosis not present

## 2021-02-08 DIAGNOSIS — I10 Essential (primary) hypertension: Secondary | ICD-10-CM | POA: Diagnosis not present

## 2021-02-08 DIAGNOSIS — M47816 Spondylosis without myelopathy or radiculopathy, lumbar region: Secondary | ICD-10-CM | POA: Diagnosis not present

## 2021-02-08 DIAGNOSIS — Z7901 Long term (current) use of anticoagulants: Secondary | ICD-10-CM | POA: Diagnosis not present

## 2021-02-08 DIAGNOSIS — S3992XA Unspecified injury of lower back, initial encounter: Secondary | ICD-10-CM | POA: Diagnosis not present

## 2021-02-11 DIAGNOSIS — M545 Low back pain, unspecified: Secondary | ICD-10-CM | POA: Diagnosis not present

## 2021-02-11 DIAGNOSIS — R29898 Other symptoms and signs involving the musculoskeletal system: Secondary | ICD-10-CM | POA: Diagnosis not present

## 2021-02-11 DIAGNOSIS — G8929 Other chronic pain: Secondary | ICD-10-CM | POA: Diagnosis not present

## 2021-02-14 DIAGNOSIS — E1165 Type 2 diabetes mellitus with hyperglycemia: Secondary | ICD-10-CM | POA: Diagnosis not present

## 2021-02-14 DIAGNOSIS — I4891 Unspecified atrial fibrillation: Secondary | ICD-10-CM | POA: Diagnosis not present

## 2021-02-14 DIAGNOSIS — I10 Essential (primary) hypertension: Secondary | ICD-10-CM | POA: Diagnosis not present

## 2021-02-14 DIAGNOSIS — G319 Degenerative disease of nervous system, unspecified: Secondary | ICD-10-CM | POA: Diagnosis not present

## 2021-02-14 DIAGNOSIS — Z299 Encounter for prophylactic measures, unspecified: Secondary | ICD-10-CM | POA: Diagnosis not present

## 2021-04-14 DIAGNOSIS — H353131 Nonexudative age-related macular degeneration, bilateral, early dry stage: Secondary | ICD-10-CM | POA: Diagnosis not present

## 2021-04-14 DIAGNOSIS — Z961 Presence of intraocular lens: Secondary | ICD-10-CM | POA: Diagnosis not present

## 2021-04-14 DIAGNOSIS — H532 Diplopia: Secondary | ICD-10-CM | POA: Diagnosis not present

## 2021-04-14 DIAGNOSIS — H53012 Deprivation amblyopia, left eye: Secondary | ICD-10-CM | POA: Diagnosis not present

## 2021-05-02 DIAGNOSIS — I10 Essential (primary) hypertension: Secondary | ICD-10-CM | POA: Diagnosis not present

## 2021-05-02 DIAGNOSIS — E1165 Type 2 diabetes mellitus with hyperglycemia: Secondary | ICD-10-CM | POA: Diagnosis not present

## 2021-05-02 DIAGNOSIS — Z299 Encounter for prophylactic measures, unspecified: Secondary | ICD-10-CM | POA: Diagnosis not present

## 2021-05-02 DIAGNOSIS — I4891 Unspecified atrial fibrillation: Secondary | ICD-10-CM | POA: Diagnosis not present

## 2021-05-02 DIAGNOSIS — G4700 Insomnia, unspecified: Secondary | ICD-10-CM | POA: Diagnosis not present

## 2021-05-24 DIAGNOSIS — J069 Acute upper respiratory infection, unspecified: Secondary | ICD-10-CM | POA: Diagnosis not present

## 2021-05-24 DIAGNOSIS — I10 Essential (primary) hypertension: Secondary | ICD-10-CM | POA: Diagnosis not present

## 2021-05-24 DIAGNOSIS — Z299 Encounter for prophylactic measures, unspecified: Secondary | ICD-10-CM | POA: Diagnosis not present

## 2021-06-17 DIAGNOSIS — M6289 Other specified disorders of muscle: Secondary | ICD-10-CM | POA: Diagnosis not present

## 2021-06-17 DIAGNOSIS — R531 Weakness: Secondary | ICD-10-CM | POA: Diagnosis not present

## 2021-06-17 DIAGNOSIS — R002 Palpitations: Secondary | ICD-10-CM | POA: Diagnosis not present

## 2021-06-17 DIAGNOSIS — G4700 Insomnia, unspecified: Secondary | ICD-10-CM | POA: Diagnosis not present

## 2021-06-26 DIAGNOSIS — L259 Unspecified contact dermatitis, unspecified cause: Secondary | ICD-10-CM | POA: Diagnosis not present

## 2021-07-12 DIAGNOSIS — R0609 Other forms of dyspnea: Secondary | ICD-10-CM | POA: Diagnosis not present

## 2021-07-12 DIAGNOSIS — G4700 Insomnia, unspecified: Secondary | ICD-10-CM | POA: Diagnosis not present

## 2021-07-12 DIAGNOSIS — Z79899 Other long term (current) drug therapy: Secondary | ICD-10-CM | POA: Diagnosis not present

## 2021-07-12 DIAGNOSIS — R002 Palpitations: Secondary | ICD-10-CM | POA: Diagnosis not present

## 2021-07-12 DIAGNOSIS — I4891 Unspecified atrial fibrillation: Secondary | ICD-10-CM | POA: Diagnosis not present

## 2021-08-02 DIAGNOSIS — Z789 Other specified health status: Secondary | ICD-10-CM | POA: Diagnosis not present

## 2021-08-02 DIAGNOSIS — I739 Peripheral vascular disease, unspecified: Secondary | ICD-10-CM | POA: Diagnosis not present

## 2021-08-02 DIAGNOSIS — I700 Atherosclerosis of aorta: Secondary | ICD-10-CM | POA: Diagnosis not present

## 2021-08-02 DIAGNOSIS — Z299 Encounter for prophylactic measures, unspecified: Secondary | ICD-10-CM | POA: Diagnosis not present

## 2021-08-02 DIAGNOSIS — I4891 Unspecified atrial fibrillation: Secondary | ICD-10-CM | POA: Diagnosis not present

## 2021-08-02 DIAGNOSIS — I10 Essential (primary) hypertension: Secondary | ICD-10-CM | POA: Diagnosis not present

## 2021-11-02 DIAGNOSIS — M19011 Primary osteoarthritis, right shoulder: Secondary | ICD-10-CM | POA: Diagnosis not present

## 2021-11-02 DIAGNOSIS — M25511 Pain in right shoulder: Secondary | ICD-10-CM | POA: Diagnosis not present

## 2021-11-02 DIAGNOSIS — M19012 Primary osteoarthritis, left shoulder: Secondary | ICD-10-CM | POA: Diagnosis not present

## 2021-11-02 DIAGNOSIS — M25512 Pain in left shoulder: Secondary | ICD-10-CM | POA: Diagnosis not present

## 2021-11-07 ENCOUNTER — Telehealth: Payer: Self-pay

## 2021-11-07 NOTE — Telephone Encounter (Signed)
Patient calling to verify medication can continue being prescribed due to difficulty coming to office.  Please advise.  Call back:  [phone]  Thanks, Helene Kelp

## 2021-11-08 NOTE — Telephone Encounter (Signed)
Rx is Rapaflo '8mg'$ , last visit with you on 05/20/2019.  Please see below, does the patient need to be seen in office?  Please advise.

## 2021-11-15 ENCOUNTER — Other Ambulatory Visit: Payer: Self-pay

## 2021-11-15 DIAGNOSIS — R5383 Other fatigue: Secondary | ICD-10-CM | POA: Diagnosis not present

## 2021-11-15 DIAGNOSIS — Z79899 Other long term (current) drug therapy: Secondary | ICD-10-CM | POA: Diagnosis not present

## 2021-11-15 DIAGNOSIS — Z299 Encounter for prophylactic measures, unspecified: Secondary | ICD-10-CM | POA: Diagnosis not present

## 2021-11-15 DIAGNOSIS — Z7189 Other specified counseling: Secondary | ICD-10-CM | POA: Diagnosis not present

## 2021-11-15 DIAGNOSIS — Z0000 Encounter for general adult medical examination without abnormal findings: Secondary | ICD-10-CM | POA: Diagnosis not present

## 2021-11-15 DIAGNOSIS — Z1339 Encounter for screening examination for other mental health and behavioral disorders: Secondary | ICD-10-CM | POA: Diagnosis not present

## 2021-11-15 DIAGNOSIS — Z1331 Encounter for screening for depression: Secondary | ICD-10-CM | POA: Diagnosis not present

## 2021-11-15 DIAGNOSIS — Z125 Encounter for screening for malignant neoplasm of prostate: Secondary | ICD-10-CM | POA: Diagnosis not present

## 2021-11-15 DIAGNOSIS — E7800 Pure hypercholesterolemia, unspecified: Secondary | ICD-10-CM | POA: Diagnosis not present

## 2021-11-15 DIAGNOSIS — Z6827 Body mass index (BMI) 27.0-27.9, adult: Secondary | ICD-10-CM | POA: Diagnosis not present

## 2021-11-15 DIAGNOSIS — I10 Essential (primary) hypertension: Secondary | ICD-10-CM | POA: Diagnosis not present

## 2021-11-15 MED ORDER — SILODOSIN 8 MG PO CAPS: 8.0000 mg | ORAL_CAPSULE | Freq: Every day | ORAL | 11 refills | Status: DC

## 2021-11-15 NOTE — Telephone Encounter (Signed)
Resent rx for Rapaflo with 11 refills.  Scheduled patient for f/u on 12/05 and apt reminder mailed to patient.  Patient requested address be updated in chart.  Updated per patient.    Franchot Gallo, MD  Audie Box, CMA Caller: Unspecified (1 week ago) OK to rf for a yr but he does need to check in w/ me soon as it has been 2 1/2 yrs since last visit

## 2021-11-15 NOTE — Progress Notes (Unsigned)
Cardiology Office Note  Date: 11/16/2021   ID: Antonio Moreno, DOB 07-Dec-1920, MRN [id number]  PCP:  Glenda Chroman, MD  Cardiologist:  Rozann Lesches, MD Electrophysiologist:  None   Chief Complaint  Patient presents with   Cardiac follow-up    History of Present Illness: Antonio Moreno is a 88 y.o. male last seen in October 2022.  He is here for a routine visit.  He does not describe any sense of palpitations, no exertional chest pain with basic ADLs.  He is still following with Dr. Woody Seller on a routine basis, was switched from Coumadin to low-dose Eliquis for stroke prophylaxis.  I reviewed the remainder of his medications which are outlined below.  Today's ECG shows sinus rhythm with prolonged PR interval and nonspecific T wave changes.  He does not describe any sudden dizziness or syncope.  Past Medical History:  Diagnosis Date   Atypical chest pain    Cardiolite 2009 without ischemia    Antonio polyps    First degree AV block    HTN (hypertension)    Internal hemorrhoids    Mild carotid artery disease (HCC)    1-39% stenosis bilaterally   PAF (paroxysmal atrial fibrillation) (HCC)    On Coumadin   Squamous cell carcinoma of skin 05/11/2009   Right sideburn - tx p bx    Past Surgical History:  Procedure Laterality Date   Abcess of Right Thigh     CHOLECYSTECTOMY     COLONOSCOPY N/A 11/18/2013   Procedure: COLONOSCOPY;  Surgeon: Rogene Houston, MD;  [facility];  Service: Endoscopy;  Laterality: N/A;  730   LUMBAR LAMINECTOMY      Current Outpatient Medications  Medication Sig Dispense Refill   dutasteride (AVODART) 0.5 MG capsule Take 0.5 mg by mouth daily.       ELIQUIS 2.5 MG TABS tablet Take 2.5 mg by mouth 2 (two) times daily.     flurazepam (DALMANE) 15 MG capsule Take 15 mg by mouth at bedtime as needed.     furosemide (LASIX) 20 MG tablet TAKE 1 TABLET BY MOUTH DAILY AS NEEDED FOR SWELLING 15 tablet 3   lisinopril (PRINIVIL,ZESTRIL) 10 MG  tablet Take 10 mg by mouth 2 (two) times daily.     propranolol (INDERAL LA) 60 MG 24 hr capsule Take 60 mg by mouth daily.       silodosin (RAPAFLO) 8 MG CAPS capsule Take 1 capsule (8 mg total) by mouth daily with breakfast. 30 capsule 11   No current facility-administered medications for this visit.   Allergies:  Penicillins   ROS: No orthopnea or PND.  Insomnia.  Physical Exam: VS:  BP (!) 158/98   Pulse 83   Ht '5\' 11"'$  (1.803 m)   Wt 195 lb (88.5 kg)   SpO2 99%   BMI 27.20 kg/m , BMI Body mass index is 27.2 kg/m.  Wt Readings from Last 3 Encounters:  11/16/21 195 lb (88.5 kg)  11/16/20 197 lb 3.2 oz (89.4 kg)  06/24/20 200 lb 6.4 oz (90.9 kg)    General: Patient appears comfortable at rest. HEENT: Conjunctiva and lids normal. Neck: Supple, no elevated JVP or carotid bruits. Lungs: Clear to auscultation, nonlabored breathing at rest. Cardiac: Regular rate and rhythm, no S3, 1/6 systolic murmur, no pericardial rub. Extremities: Mild lower leg edema.  ECG:  An ECG dated 06/24/2020 was personally reviewed today and demonstrated:  Sinus rhythm with prolonged PR interval and increased voltage.  Recent Labwork:  06/24/2020: BUN 17; Creatinine, Ser 0.81; Hemoglobin 13.3; Platelets 227; Potassium 4.5; Sodium 134   Other Studies Reviewed Today:  No interval cardiac testing for review today.  Assessment and Plan:  1.  Paroxysmal atrial fibrillation with CHA2DS2-VASc score of 4.  He is on Inderal as before, reports no palpitations and is in sinus rhythm by ECG today.  Now on Eliquis for stroke prophylaxis, low-dose as per Dr. Woody Seller.  2.  Elevated blood pressure.  No change in current dose of lisinopril.  I asked him to check blood pressure with home cuff periodically and follow-up with Dr. Woody Seller.  Medication Adjustments/Labs and Tests Ordered: Current medicines are reviewed at length with the patient today.  Concerns regarding medicines are outlined above.   Tests  Ordered: Orders Placed This Encounter  Procedures   EKG 12-Lead    Medication Changes: No orders of the defined types were placed in this encounter.   Disposition:  Follow up  1 year, sooner if needed.  Signed, Satira Sark, MD, [facility] 11/16/2021 9:30 AM    [facility] [place], [place], [place] [address] Phone: [phone]; Fax: [phone]

## 2021-11-16 ENCOUNTER — Encounter: Payer: Self-pay | Admitting: Cardiology

## 2021-11-16 ENCOUNTER — Ambulatory Visit: Payer: Medicare Other | Attending: Cardiology | Admitting: Cardiology

## 2021-11-16 VITALS — BP 158/98 | HR 83 | Ht 71.0 in | Wt 195.0 lb

## 2021-11-16 DIAGNOSIS — I10 Essential (primary) hypertension: Secondary | ICD-10-CM | POA: Diagnosis not present

## 2021-11-16 DIAGNOSIS — I480 Paroxysmal atrial fibrillation: Secondary | ICD-10-CM | POA: Diagnosis not present

## 2021-11-16 NOTE — Patient Instructions (Signed)
Medication Instructions:  Your physician recommends that you continue on your current medications as directed. Please refer to the Current Medication list given to you today.   Labwork: none  Testing/Procedures: none  Follow-Up:  Your physician recommends that you schedule a follow-up appointment in: 1 year   Any Other Special Instructions Will Be Listed Below (If Applicable).  You will receive a call in about 10 months reminding you to schedule your appointment. If you do not receive this call, please contact our office.  If you need a refill on your cardiac medications before your next appointment, please call your pharmacy.  

## 2021-11-29 DIAGNOSIS — M19011 Primary osteoarthritis, right shoulder: Secondary | ICD-10-CM | POA: Diagnosis not present

## 2021-11-29 DIAGNOSIS — M19012 Primary osteoarthritis, left shoulder: Secondary | ICD-10-CM | POA: Diagnosis not present

## 2021-12-07 DIAGNOSIS — Z23 Encounter for immunization: Secondary | ICD-10-CM | POA: Diagnosis not present

## 2022-01-01 DIAGNOSIS — Z23 Encounter for immunization: Secondary | ICD-10-CM | POA: Diagnosis not present

## 2022-01-10 ENCOUNTER — Ambulatory Visit: Payer: Medicare Other | Admitting: Urology

## 2022-01-18 DIAGNOSIS — M25512 Pain in left shoulder: Secondary | ICD-10-CM | POA: Diagnosis not present

## 2022-01-18 DIAGNOSIS — M25511 Pain in right shoulder: Secondary | ICD-10-CM | POA: Diagnosis not present

## 2022-01-18 DIAGNOSIS — M19011 Primary osteoarthritis, right shoulder: Secondary | ICD-10-CM | POA: Diagnosis not present

## 2022-01-18 DIAGNOSIS — M19012 Primary osteoarthritis, left shoulder: Secondary | ICD-10-CM | POA: Diagnosis not present

## 2022-01-23 DIAGNOSIS — M179 Osteoarthritis of knee, unspecified: Secondary | ICD-10-CM | POA: Diagnosis not present

## 2022-01-23 DIAGNOSIS — R296 Repeated falls: Secondary | ICD-10-CM | POA: Diagnosis not present

## 2022-02-10 DIAGNOSIS — M1990 Unspecified osteoarthritis, unspecified site: Secondary | ICD-10-CM | POA: Diagnosis not present

## 2022-02-10 DIAGNOSIS — E1165 Type 2 diabetes mellitus with hyperglycemia: Secondary | ICD-10-CM | POA: Diagnosis not present

## 2022-02-10 DIAGNOSIS — K219 Gastro-esophageal reflux disease without esophagitis: Secondary | ICD-10-CM | POA: Diagnosis not present

## 2022-02-10 DIAGNOSIS — I2510 Atherosclerotic heart disease of native coronary artery without angina pectoris: Secondary | ICD-10-CM | POA: Diagnosis not present

## 2022-02-10 DIAGNOSIS — D6869 Other thrombophilia: Secondary | ICD-10-CM | POA: Diagnosis not present

## 2022-02-10 DIAGNOSIS — J45909 Unspecified asthma, uncomplicated: Secondary | ICD-10-CM | POA: Diagnosis not present

## 2022-02-10 DIAGNOSIS — E1129 Type 2 diabetes mellitus with other diabetic kidney complication: Secondary | ICD-10-CM | POA: Diagnosis not present

## 2022-02-10 DIAGNOSIS — E7800 Pure hypercholesterolemia, unspecified: Secondary | ICD-10-CM | POA: Diagnosis not present

## 2022-02-10 DIAGNOSIS — K589 Irritable bowel syndrome without diarrhea: Secondary | ICD-10-CM | POA: Diagnosis not present

## 2022-02-10 DIAGNOSIS — M170 Bilateral primary osteoarthritis of knee: Secondary | ICD-10-CM | POA: Diagnosis not present

## 2022-02-10 DIAGNOSIS — I10 Essential (primary) hypertension: Secondary | ICD-10-CM | POA: Diagnosis not present

## 2022-02-10 DIAGNOSIS — I4891 Unspecified atrial fibrillation: Secondary | ICD-10-CM | POA: Diagnosis not present

## 2022-02-10 DIAGNOSIS — G4700 Insomnia, unspecified: Secondary | ICD-10-CM | POA: Diagnosis not present

## 2022-02-10 DIAGNOSIS — D696 Thrombocytopenia, unspecified: Secondary | ICD-10-CM | POA: Diagnosis not present

## 2022-02-10 DIAGNOSIS — Z7901 Long term (current) use of anticoagulants: Secondary | ICD-10-CM | POA: Diagnosis not present

## 2022-02-10 DIAGNOSIS — F419 Anxiety disorder, unspecified: Secondary | ICD-10-CM | POA: Diagnosis not present

## 2022-02-15 DIAGNOSIS — E1165 Type 2 diabetes mellitus with hyperglycemia: Secondary | ICD-10-CM | POA: Diagnosis not present

## 2022-02-15 DIAGNOSIS — E1129 Type 2 diabetes mellitus with other diabetic kidney complication: Secondary | ICD-10-CM | POA: Diagnosis not present

## 2022-02-15 DIAGNOSIS — M1990 Unspecified osteoarthritis, unspecified site: Secondary | ICD-10-CM | POA: Diagnosis not present

## 2022-02-15 DIAGNOSIS — I4891 Unspecified atrial fibrillation: Secondary | ICD-10-CM | POA: Diagnosis not present

## 2022-02-15 DIAGNOSIS — M170 Bilateral primary osteoarthritis of knee: Secondary | ICD-10-CM | POA: Diagnosis not present

## 2022-02-15 DIAGNOSIS — F419 Anxiety disorder, unspecified: Secondary | ICD-10-CM | POA: Diagnosis not present

## 2022-02-17 DIAGNOSIS — M170 Bilateral primary osteoarthritis of knee: Secondary | ICD-10-CM | POA: Diagnosis not present

## 2022-02-17 DIAGNOSIS — I4891 Unspecified atrial fibrillation: Secondary | ICD-10-CM | POA: Diagnosis not present

## 2022-02-17 DIAGNOSIS — M1990 Unspecified osteoarthritis, unspecified site: Secondary | ICD-10-CM | POA: Diagnosis not present

## 2022-02-17 DIAGNOSIS — E1165 Type 2 diabetes mellitus with hyperglycemia: Secondary | ICD-10-CM | POA: Diagnosis not present

## 2022-02-17 DIAGNOSIS — E1129 Type 2 diabetes mellitus with other diabetic kidney complication: Secondary | ICD-10-CM | POA: Diagnosis not present

## 2022-02-17 DIAGNOSIS — F419 Anxiety disorder, unspecified: Secondary | ICD-10-CM | POA: Diagnosis not present

## 2022-02-21 DIAGNOSIS — E1165 Type 2 diabetes mellitus with hyperglycemia: Secondary | ICD-10-CM | POA: Diagnosis not present

## 2022-02-21 DIAGNOSIS — M170 Bilateral primary osteoarthritis of knee: Secondary | ICD-10-CM | POA: Diagnosis not present

## 2022-02-21 DIAGNOSIS — I4891 Unspecified atrial fibrillation: Secondary | ICD-10-CM | POA: Diagnosis not present

## 2022-02-21 DIAGNOSIS — E1129 Type 2 diabetes mellitus with other diabetic kidney complication: Secondary | ICD-10-CM | POA: Diagnosis not present

## 2022-02-21 DIAGNOSIS — M1990 Unspecified osteoarthritis, unspecified site: Secondary | ICD-10-CM | POA: Diagnosis not present

## 2022-02-21 DIAGNOSIS — F419 Anxiety disorder, unspecified: Secondary | ICD-10-CM | POA: Diagnosis not present

## 2022-02-23 DIAGNOSIS — F419 Anxiety disorder, unspecified: Secondary | ICD-10-CM | POA: Diagnosis not present

## 2022-02-23 DIAGNOSIS — E1129 Type 2 diabetes mellitus with other diabetic kidney complication: Secondary | ICD-10-CM | POA: Diagnosis not present

## 2022-02-23 DIAGNOSIS — M170 Bilateral primary osteoarthritis of knee: Secondary | ICD-10-CM | POA: Diagnosis not present

## 2022-02-23 DIAGNOSIS — E1165 Type 2 diabetes mellitus with hyperglycemia: Secondary | ICD-10-CM | POA: Diagnosis not present

## 2022-02-23 DIAGNOSIS — M1990 Unspecified osteoarthritis, unspecified site: Secondary | ICD-10-CM | POA: Diagnosis not present

## 2022-02-23 DIAGNOSIS — I4891 Unspecified atrial fibrillation: Secondary | ICD-10-CM | POA: Diagnosis not present

## 2022-02-24 DIAGNOSIS — I6782 Cerebral ischemia: Secondary | ICD-10-CM | POA: Diagnosis not present

## 2022-02-24 DIAGNOSIS — R918 Other nonspecific abnormal finding of lung field: Secondary | ICD-10-CM | POA: Diagnosis not present

## 2022-02-24 DIAGNOSIS — R531 Weakness: Secondary | ICD-10-CM | POA: Diagnosis not present

## 2022-02-24 DIAGNOSIS — R410 Disorientation, unspecified: Secondary | ICD-10-CM | POA: Diagnosis not present

## 2022-02-24 DIAGNOSIS — G4700 Insomnia, unspecified: Secondary | ICD-10-CM | POA: Diagnosis not present

## 2022-02-24 DIAGNOSIS — Z79899 Other long term (current) drug therapy: Secondary | ICD-10-CM | POA: Diagnosis not present

## 2022-02-28 DIAGNOSIS — I4891 Unspecified atrial fibrillation: Secondary | ICD-10-CM | POA: Diagnosis not present

## 2022-02-28 DIAGNOSIS — E1129 Type 2 diabetes mellitus with other diabetic kidney complication: Secondary | ICD-10-CM | POA: Diagnosis not present

## 2022-02-28 DIAGNOSIS — F419 Anxiety disorder, unspecified: Secondary | ICD-10-CM | POA: Diagnosis not present

## 2022-02-28 DIAGNOSIS — M1990 Unspecified osteoarthritis, unspecified site: Secondary | ICD-10-CM | POA: Diagnosis not present

## 2022-02-28 DIAGNOSIS — E1165 Type 2 diabetes mellitus with hyperglycemia: Secondary | ICD-10-CM | POA: Diagnosis not present

## 2022-02-28 DIAGNOSIS — M170 Bilateral primary osteoarthritis of knee: Secondary | ICD-10-CM | POA: Diagnosis not present

## 2022-03-02 DIAGNOSIS — I10 Essential (primary) hypertension: Secondary | ICD-10-CM | POA: Diagnosis not present

## 2022-03-02 DIAGNOSIS — G319 Degenerative disease of nervous system, unspecified: Secondary | ICD-10-CM | POA: Diagnosis not present

## 2022-03-02 DIAGNOSIS — Z299 Encounter for prophylactic measures, unspecified: Secondary | ICD-10-CM | POA: Diagnosis not present

## 2022-03-02 DIAGNOSIS — I4891 Unspecified atrial fibrillation: Secondary | ICD-10-CM | POA: Diagnosis not present

## 2022-03-02 DIAGNOSIS — I739 Peripheral vascular disease, unspecified: Secondary | ICD-10-CM | POA: Diagnosis not present

## 2022-03-03 DIAGNOSIS — I4891 Unspecified atrial fibrillation: Secondary | ICD-10-CM | POA: Diagnosis not present

## 2022-03-03 DIAGNOSIS — E1165 Type 2 diabetes mellitus with hyperglycemia: Secondary | ICD-10-CM | POA: Diagnosis not present

## 2022-03-03 DIAGNOSIS — R519 Headache, unspecified: Secondary | ICD-10-CM | POA: Diagnosis not present

## 2022-03-03 DIAGNOSIS — F419 Anxiety disorder, unspecified: Secondary | ICD-10-CM | POA: Diagnosis not present

## 2022-03-03 DIAGNOSIS — J90 Pleural effusion, not elsewhere classified: Secondary | ICD-10-CM | POA: Diagnosis not present

## 2022-03-03 DIAGNOSIS — M1990 Unspecified osteoarthritis, unspecified site: Secondary | ICD-10-CM | POA: Diagnosis not present

## 2022-03-03 DIAGNOSIS — S3993XA Unspecified injury of pelvis, initial encounter: Secondary | ICD-10-CM | POA: Diagnosis not present

## 2022-03-03 DIAGNOSIS — G4700 Insomnia, unspecified: Secondary | ICD-10-CM | POA: Diagnosis not present

## 2022-03-03 DIAGNOSIS — R918 Other nonspecific abnormal finding of lung field: Secondary | ICD-10-CM | POA: Diagnosis not present

## 2022-03-03 DIAGNOSIS — Z79899 Other long term (current) drug therapy: Secondary | ICD-10-CM | POA: Diagnosis not present

## 2022-03-03 DIAGNOSIS — E1129 Type 2 diabetes mellitus with other diabetic kidney complication: Secondary | ICD-10-CM | POA: Diagnosis not present

## 2022-03-03 DIAGNOSIS — M170 Bilateral primary osteoarthritis of knee: Secondary | ICD-10-CM | POA: Diagnosis not present

## 2022-03-03 DIAGNOSIS — M25752 Osteophyte, left hip: Secondary | ICD-10-CM | POA: Diagnosis not present

## 2022-03-03 DIAGNOSIS — S0990XA Unspecified injury of head, initial encounter: Secondary | ICD-10-CM | POA: Diagnosis not present

## 2022-03-03 DIAGNOSIS — S199XXA Unspecified injury of neck, initial encounter: Secondary | ICD-10-CM | POA: Diagnosis not present

## 2022-03-03 DIAGNOSIS — M25551 Pain in right hip: Secondary | ICD-10-CM | POA: Diagnosis not present

## 2022-03-03 DIAGNOSIS — R072 Precordial pain: Secondary | ICD-10-CM | POA: Diagnosis not present

## 2022-03-03 DIAGNOSIS — S299XXA Unspecified injury of thorax, initial encounter: Secondary | ICD-10-CM | POA: Diagnosis not present

## 2022-03-03 DIAGNOSIS — Z7901 Long term (current) use of anticoagulants: Secondary | ICD-10-CM | POA: Diagnosis not present

## 2022-03-06 DIAGNOSIS — M1990 Unspecified osteoarthritis, unspecified site: Secondary | ICD-10-CM | POA: Diagnosis not present

## 2022-03-06 DIAGNOSIS — M170 Bilateral primary osteoarthritis of knee: Secondary | ICD-10-CM | POA: Diagnosis not present

## 2022-03-06 DIAGNOSIS — E1165 Type 2 diabetes mellitus with hyperglycemia: Secondary | ICD-10-CM | POA: Diagnosis not present

## 2022-03-06 DIAGNOSIS — I4891 Unspecified atrial fibrillation: Secondary | ICD-10-CM | POA: Diagnosis not present

## 2022-03-06 DIAGNOSIS — E1129 Type 2 diabetes mellitus with other diabetic kidney complication: Secondary | ICD-10-CM | POA: Diagnosis not present

## 2022-03-06 DIAGNOSIS — F419 Anxiety disorder, unspecified: Secondary | ICD-10-CM | POA: Diagnosis not present

## 2022-03-08 DIAGNOSIS — M170 Bilateral primary osteoarthritis of knee: Secondary | ICD-10-CM | POA: Diagnosis not present

## 2022-03-08 DIAGNOSIS — I4891 Unspecified atrial fibrillation: Secondary | ICD-10-CM | POA: Diagnosis not present

## 2022-03-08 DIAGNOSIS — E1129 Type 2 diabetes mellitus with other diabetic kidney complication: Secondary | ICD-10-CM | POA: Diagnosis not present

## 2022-03-08 DIAGNOSIS — F419 Anxiety disorder, unspecified: Secondary | ICD-10-CM | POA: Diagnosis not present

## 2022-03-08 DIAGNOSIS — E1165 Type 2 diabetes mellitus with hyperglycemia: Secondary | ICD-10-CM | POA: Diagnosis not present

## 2022-03-08 DIAGNOSIS — M1990 Unspecified osteoarthritis, unspecified site: Secondary | ICD-10-CM | POA: Diagnosis not present

## 2022-03-09 ENCOUNTER — Other Ambulatory Visit: Payer: Self-pay | Admitting: Cardiology

## 2022-03-12 DIAGNOSIS — G4700 Insomnia, unspecified: Secondary | ICD-10-CM | POA: Diagnosis not present

## 2022-03-12 DIAGNOSIS — E7800 Pure hypercholesterolemia, unspecified: Secondary | ICD-10-CM | POA: Diagnosis not present

## 2022-03-12 DIAGNOSIS — I4891 Unspecified atrial fibrillation: Secondary | ICD-10-CM | POA: Diagnosis not present

## 2022-03-12 DIAGNOSIS — J45909 Unspecified asthma, uncomplicated: Secondary | ICD-10-CM | POA: Diagnosis not present

## 2022-03-12 DIAGNOSIS — Z7901 Long term (current) use of anticoagulants: Secondary | ICD-10-CM | POA: Diagnosis not present

## 2022-03-12 DIAGNOSIS — I2510 Atherosclerotic heart disease of native coronary artery without angina pectoris: Secondary | ICD-10-CM | POA: Diagnosis not present

## 2022-03-12 DIAGNOSIS — K589 Irritable bowel syndrome without diarrhea: Secondary | ICD-10-CM | POA: Diagnosis not present

## 2022-03-12 DIAGNOSIS — E1129 Type 2 diabetes mellitus with other diabetic kidney complication: Secondary | ICD-10-CM | POA: Diagnosis not present

## 2022-03-12 DIAGNOSIS — M170 Bilateral primary osteoarthritis of knee: Secondary | ICD-10-CM | POA: Diagnosis not present

## 2022-03-12 DIAGNOSIS — M1990 Unspecified osteoarthritis, unspecified site: Secondary | ICD-10-CM | POA: Diagnosis not present

## 2022-03-12 DIAGNOSIS — D696 Thrombocytopenia, unspecified: Secondary | ICD-10-CM | POA: Diagnosis not present

## 2022-03-12 DIAGNOSIS — F419 Anxiety disorder, unspecified: Secondary | ICD-10-CM | POA: Diagnosis not present

## 2022-03-12 DIAGNOSIS — E1165 Type 2 diabetes mellitus with hyperglycemia: Secondary | ICD-10-CM | POA: Diagnosis not present

## 2022-03-12 DIAGNOSIS — D6869 Other thrombophilia: Secondary | ICD-10-CM | POA: Diagnosis not present

## 2022-03-12 DIAGNOSIS — I10 Essential (primary) hypertension: Secondary | ICD-10-CM | POA: Diagnosis not present

## 2022-03-12 DIAGNOSIS — K219 Gastro-esophageal reflux disease without esophagitis: Secondary | ICD-10-CM | POA: Diagnosis not present

## 2022-03-14 DIAGNOSIS — I4891 Unspecified atrial fibrillation: Secondary | ICD-10-CM | POA: Diagnosis not present

## 2022-03-14 DIAGNOSIS — M1990 Unspecified osteoarthritis, unspecified site: Secondary | ICD-10-CM | POA: Diagnosis not present

## 2022-03-14 DIAGNOSIS — E1129 Type 2 diabetes mellitus with other diabetic kidney complication: Secondary | ICD-10-CM | POA: Diagnosis not present

## 2022-03-14 DIAGNOSIS — F419 Anxiety disorder, unspecified: Secondary | ICD-10-CM | POA: Diagnosis not present

## 2022-03-14 DIAGNOSIS — M170 Bilateral primary osteoarthritis of knee: Secondary | ICD-10-CM | POA: Diagnosis not present

## 2022-03-14 DIAGNOSIS — E1165 Type 2 diabetes mellitus with hyperglycemia: Secondary | ICD-10-CM | POA: Diagnosis not present

## 2022-03-16 DIAGNOSIS — M1990 Unspecified osteoarthritis, unspecified site: Secondary | ICD-10-CM | POA: Diagnosis not present

## 2022-03-16 DIAGNOSIS — E1165 Type 2 diabetes mellitus with hyperglycemia: Secondary | ICD-10-CM | POA: Diagnosis not present

## 2022-03-16 DIAGNOSIS — M170 Bilateral primary osteoarthritis of knee: Secondary | ICD-10-CM | POA: Diagnosis not present

## 2022-03-16 DIAGNOSIS — I4891 Unspecified atrial fibrillation: Secondary | ICD-10-CM | POA: Diagnosis not present

## 2022-03-16 DIAGNOSIS — F419 Anxiety disorder, unspecified: Secondary | ICD-10-CM | POA: Diagnosis not present

## 2022-03-16 DIAGNOSIS — E1129 Type 2 diabetes mellitus with other diabetic kidney complication: Secondary | ICD-10-CM | POA: Diagnosis not present

## 2022-03-21 DIAGNOSIS — F419 Anxiety disorder, unspecified: Secondary | ICD-10-CM | POA: Diagnosis not present

## 2022-03-21 DIAGNOSIS — M1990 Unspecified osteoarthritis, unspecified site: Secondary | ICD-10-CM | POA: Diagnosis not present

## 2022-03-21 DIAGNOSIS — E1165 Type 2 diabetes mellitus with hyperglycemia: Secondary | ICD-10-CM | POA: Diagnosis not present

## 2022-03-21 DIAGNOSIS — M170 Bilateral primary osteoarthritis of knee: Secondary | ICD-10-CM | POA: Diagnosis not present

## 2022-03-21 DIAGNOSIS — I4891 Unspecified atrial fibrillation: Secondary | ICD-10-CM | POA: Diagnosis not present

## 2022-03-21 DIAGNOSIS — E1129 Type 2 diabetes mellitus with other diabetic kidney complication: Secondary | ICD-10-CM | POA: Diagnosis not present

## 2022-03-23 DIAGNOSIS — F419 Anxiety disorder, unspecified: Secondary | ICD-10-CM | POA: Diagnosis not present

## 2022-03-23 DIAGNOSIS — M170 Bilateral primary osteoarthritis of knee: Secondary | ICD-10-CM | POA: Diagnosis not present

## 2022-03-23 DIAGNOSIS — I4891 Unspecified atrial fibrillation: Secondary | ICD-10-CM | POA: Diagnosis not present

## 2022-03-23 DIAGNOSIS — E1129 Type 2 diabetes mellitus with other diabetic kidney complication: Secondary | ICD-10-CM | POA: Diagnosis not present

## 2022-03-23 DIAGNOSIS — M1990 Unspecified osteoarthritis, unspecified site: Secondary | ICD-10-CM | POA: Diagnosis not present

## 2022-03-23 DIAGNOSIS — E1165 Type 2 diabetes mellitus with hyperglycemia: Secondary | ICD-10-CM | POA: Diagnosis not present

## 2022-03-28 DIAGNOSIS — E1165 Type 2 diabetes mellitus with hyperglycemia: Secondary | ICD-10-CM | POA: Diagnosis not present

## 2022-03-28 DIAGNOSIS — M170 Bilateral primary osteoarthritis of knee: Secondary | ICD-10-CM | POA: Diagnosis not present

## 2022-03-28 DIAGNOSIS — I4891 Unspecified atrial fibrillation: Secondary | ICD-10-CM | POA: Diagnosis not present

## 2022-03-28 DIAGNOSIS — E1129 Type 2 diabetes mellitus with other diabetic kidney complication: Secondary | ICD-10-CM | POA: Diagnosis not present

## 2022-03-28 DIAGNOSIS — F419 Anxiety disorder, unspecified: Secondary | ICD-10-CM | POA: Diagnosis not present

## 2022-03-28 DIAGNOSIS — M1990 Unspecified osteoarthritis, unspecified site: Secondary | ICD-10-CM | POA: Diagnosis not present

## 2022-03-30 DIAGNOSIS — M170 Bilateral primary osteoarthritis of knee: Secondary | ICD-10-CM | POA: Diagnosis not present

## 2022-03-30 DIAGNOSIS — M1990 Unspecified osteoarthritis, unspecified site: Secondary | ICD-10-CM | POA: Diagnosis not present

## 2022-03-30 DIAGNOSIS — I4891 Unspecified atrial fibrillation: Secondary | ICD-10-CM | POA: Diagnosis not present

## 2022-03-30 DIAGNOSIS — E1165 Type 2 diabetes mellitus with hyperglycemia: Secondary | ICD-10-CM | POA: Diagnosis not present

## 2022-03-30 DIAGNOSIS — F419 Anxiety disorder, unspecified: Secondary | ICD-10-CM | POA: Diagnosis not present

## 2022-03-30 DIAGNOSIS — E1129 Type 2 diabetes mellitus with other diabetic kidney complication: Secondary | ICD-10-CM | POA: Diagnosis not present

## 2022-04-06 DIAGNOSIS — M1990 Unspecified osteoarthritis, unspecified site: Secondary | ICD-10-CM | POA: Diagnosis not present

## 2022-04-06 DIAGNOSIS — E1129 Type 2 diabetes mellitus with other diabetic kidney complication: Secondary | ICD-10-CM | POA: Diagnosis not present

## 2022-04-06 DIAGNOSIS — M170 Bilateral primary osteoarthritis of knee: Secondary | ICD-10-CM | POA: Diagnosis not present

## 2022-04-06 DIAGNOSIS — E1165 Type 2 diabetes mellitus with hyperglycemia: Secondary | ICD-10-CM | POA: Diagnosis not present

## 2022-04-06 DIAGNOSIS — I4891 Unspecified atrial fibrillation: Secondary | ICD-10-CM | POA: Diagnosis not present

## 2022-04-06 DIAGNOSIS — F419 Anxiety disorder, unspecified: Secondary | ICD-10-CM | POA: Diagnosis not present

## 2022-04-10 ENCOUNTER — Other Ambulatory Visit: Payer: Self-pay | Admitting: *Deleted

## 2022-04-10 MED ORDER — FUROSEMIDE 20 MG PO TABS: ORAL_TABLET | ORAL | 1 refills | Status: DC

## 2022-05-12 ENCOUNTER — Telehealth: Payer: Self-pay | Admitting: Cardiology

## 2022-05-12 NOTE — Telephone Encounter (Signed)
Pt c/o medication issue:  1. Name of Medication: furosemide (LASIX) 20 MG tablet   2. How are you currently taking this medication (dosage and times per day)? TAKE 1 TABLET BY MOUTH DAILY AS NEEDED FOR SWELLING   3. Are you having a reaction (difficulty breathing--STAT)? No  4. What is your medication issue? His nurse Rayfield Citizen called stating he needs something less stronger then the lasix.  She said it causes him back pain, and he fell the other day.

## 2022-05-12 NOTE — Telephone Encounter (Signed)
Left message to return call 

## 2022-05-16 NOTE — Telephone Encounter (Signed)
Spoke with Rayfield Citizen who states that patient has been on lasix since last year and was doing fine. States that around the end of January, patient started having back pain which seemed to get worse when he would take the lasix. States that this is only thing that she thinks could be causing his back pain. Patient only takes the lasix as needed but has needed it more over the last few months due to the swelling. She thinks the lasix is too strong and wants to know if there is something that is less mild for the patient to take for swelling? States that the patient has been falling a lot since January and just fell a few days ago. Offered appointment but states that the patient is currently out of town and would not be able to come in for appointment. Please advise.

## 2022-05-16 NOTE — Telephone Encounter (Signed)
Left message for patient to call office.  

## 2022-05-17 NOTE — Telephone Encounter (Signed)
Patient returned CMA's call. 

## 2022-05-17 NOTE — Telephone Encounter (Signed)
Returned call, left message to call office

## 2022-05-19 MED ORDER — HYDROCHLOROTHIAZIDE 12.5 MG PO CAPS: 12.5000 mg | ORAL_CAPSULE | Freq: Every day | ORAL | 1 refills | Status: DC

## 2022-05-19 NOTE — Telephone Encounter (Signed)
Patient made aware, verbalized understanding. HCTZ 12.5 mg once daily sent to pharmacy as patient requested.

## 2022-09-07 DEATH — deceased
